# Patient Record
Sex: Female | Born: 1960 | Race: Black or African American | Hispanic: No | Marital: Single | Smoking: Current every day smoker
Health system: Southern US, Community
[De-identification: ages and names within clinical notes are randomized; demographics above are authoritative.]

## PROBLEM LIST (undated history)

## (undated) DIAGNOSIS — D649 Anemia, unspecified: Secondary | ICD-10-CM

## (undated) DIAGNOSIS — Z9289 Personal history of other medical treatment: Secondary | ICD-10-CM

## (undated) DIAGNOSIS — J42 Unspecified chronic bronchitis: Secondary | ICD-10-CM

## (undated) DIAGNOSIS — Z973 Presence of spectacles and contact lenses: Secondary | ICD-10-CM

## (undated) DIAGNOSIS — J45909 Unspecified asthma, uncomplicated: Secondary | ICD-10-CM

## (undated) DIAGNOSIS — J383 Other diseases of vocal cords: Secondary | ICD-10-CM

---

## 2005-01-19 ENCOUNTER — Emergency Department (HOSPITAL_COMMUNITY): Admission: EM | Admit: 2005-01-19 | Discharge: 2005-01-20 | Payer: Self-pay | Admitting: Emergency Medicine

## 2005-01-28 ENCOUNTER — Emergency Department (HOSPITAL_COMMUNITY): Admission: EM | Admit: 2005-01-28 | Discharge: 2005-01-28 | Payer: Self-pay | Admitting: Family Medicine

## 2006-01-18 ENCOUNTER — Emergency Department (HOSPITAL_COMMUNITY): Admission: EM | Admit: 2006-01-18 | Discharge: 2006-01-18 | Payer: Self-pay | Admitting: Family Medicine

## 2006-01-20 ENCOUNTER — Emergency Department (HOSPITAL_COMMUNITY): Admission: EM | Admit: 2006-01-20 | Discharge: 2006-01-20 | Payer: Self-pay | Admitting: Emergency Medicine

## 2006-11-05 ENCOUNTER — Emergency Department (HOSPITAL_COMMUNITY): Admission: EM | Admit: 2006-11-05 | Discharge: 2006-11-05 | Payer: Self-pay | Admitting: Emergency Medicine

## 2007-09-15 ENCOUNTER — Emergency Department (HOSPITAL_COMMUNITY): Admission: EM | Admit: 2007-09-15 | Discharge: 2007-09-16 | Payer: Self-pay | Admitting: Emergency Medicine

## 2007-09-15 ENCOUNTER — Ambulatory Visit: Payer: Self-pay | Admitting: Internal Medicine

## 2007-09-16 ENCOUNTER — Ambulatory Visit: Payer: Self-pay | Admitting: *Deleted

## 2007-10-19 ENCOUNTER — Encounter (INDEPENDENT_AMBULATORY_CARE_PROVIDER_SITE_OTHER): Payer: Self-pay | Admitting: Family Medicine

## 2007-10-19 ENCOUNTER — Ambulatory Visit: Payer: Self-pay | Admitting: Internal Medicine

## 2007-10-19 LAB — CONVERTED CEMR LAB
AST: 16 units/L (ref 0–37)
Albumin: 4.4 g/dL (ref 3.5–5.2)
BUN: 12 mg/dL (ref 6–23)
Basophils Relative: 0 % (ref 0–1)
CO2: 23 meq/L (ref 19–32)
Calcium: 10 mg/dL (ref 8.4–10.5)
Chloride: 105 meq/L (ref 96–112)
Cholesterol: 101 mg/dL (ref 0–200)
Creatinine, Ser: 1.03 mg/dL (ref 0.40–1.20)
Glucose, Bld: 88 mg/dL (ref 70–99)
HDL: 34 mg/dL — ABNORMAL LOW (ref >39)
Hemoglobin: 12.5 g/dL (ref 12.0–15.0)
Lymphocytes Relative: 27 % (ref 12–46)
Lymphs Abs: 3 10*3/uL (ref 0.7–4.0)
Monocytes Absolute: 0.8 10*3/uL (ref 0.1–1.0)
Monocytes Relative: 7 % (ref 3–12)
Neutro Abs: 6.9 10*3/uL (ref 1.7–7.7)
Neutrophils Relative %: 62 % (ref 43–77)
Potassium: 4.5 meq/L (ref 3.5–5.3)
RBC: 4.13 M/uL (ref 3.87–5.11)
WBC: 11.1 10*3/uL — ABNORMAL HIGH (ref 4.0–10.5)

## 2007-10-20 ENCOUNTER — Ambulatory Visit (HOSPITAL_COMMUNITY): Admission: RE | Admit: 2007-10-20 | Discharge: 2007-10-20 | Payer: Self-pay | Admitting: Family Medicine

## 2007-11-11 ENCOUNTER — Ambulatory Visit: Payer: Self-pay | Admitting: Family Medicine

## 2008-05-18 ENCOUNTER — Ambulatory Visit: Payer: Self-pay | Admitting: Internal Medicine

## 2008-05-18 ENCOUNTER — Other Ambulatory Visit: Admission: RE | Admit: 2008-05-18 | Discharge: 2008-05-18 | Payer: Self-pay | Admitting: Family Medicine

## 2008-05-18 ENCOUNTER — Encounter: Payer: Self-pay | Admitting: Family Medicine

## 2008-05-18 LAB — CONVERTED CEMR LAB: Chlamydia, DNA Probe: NEGATIVE

## 2009-05-24 ENCOUNTER — Ambulatory Visit: Payer: Self-pay | Admitting: Internal Medicine

## 2009-05-30 ENCOUNTER — Ambulatory Visit (HOSPITAL_COMMUNITY): Admission: RE | Admit: 2009-05-30 | Discharge: 2009-05-30 | Payer: Self-pay | Admitting: Family Medicine

## 2009-06-18 ENCOUNTER — Ambulatory Visit: Payer: Self-pay | Admitting: Internal Medicine

## 2009-07-05 ENCOUNTER — Ambulatory Visit: Payer: Self-pay | Admitting: Internal Medicine

## 2009-10-04 ENCOUNTER — Ambulatory Visit: Payer: Self-pay | Admitting: Obstetrics & Gynecology

## 2009-10-18 ENCOUNTER — Ambulatory Visit: Payer: Self-pay | Admitting: Family Medicine

## 2009-10-28 ENCOUNTER — Ambulatory Visit (HOSPITAL_COMMUNITY): Admission: RE | Admit: 2009-10-28 | Discharge: 2009-10-28 | Payer: Self-pay | Admitting: Internal Medicine

## 2009-10-29 ENCOUNTER — Ambulatory Visit (HOSPITAL_COMMUNITY): Admission: RE | Admit: 2009-10-29 | Discharge: 2009-10-29 | Payer: Self-pay | Admitting: Family Medicine

## 2009-11-17 ENCOUNTER — Inpatient Hospital Stay (HOSPITAL_COMMUNITY): Admission: AD | Admit: 2009-11-17 | Discharge: 2009-11-17 | Payer: Self-pay | Admitting: Obstetrics & Gynecology

## 2011-03-10 LAB — URINE MICROSCOPIC-ADD ON

## 2011-03-10 LAB — URINALYSIS, ROUTINE W REFLEX MICROSCOPIC
Bilirubin Urine: NEGATIVE
Glucose, UA: NEGATIVE mg/dL
Specific Gravity, Urine: >1.03 — ABNORMAL HIGH (ref 1.005–1.030)

## 2011-03-10 LAB — CBC
HCT: 32.8 % — ABNORMAL LOW (ref 36.0–46.0)
Hemoglobin: 10.8 g/dL — ABNORMAL LOW (ref 12.0–15.0)
MCHC: 32.9 g/dL (ref 30.0–36.0)
RBC: 3.73 MIL/uL — ABNORMAL LOW (ref 3.87–5.11)

## 2011-04-24 NOTE — Consult Note (Signed)
Shawna Rodgers, Shawna Rodgers                 ACCOUNT NO.:  [id number]   MEDICAL RECORD NO.:  [id number]          PATIENT TYPE:  EMS   LOCATION:  MAJO                         FACILITY:  [facility]   PHYSICIAN:  Karol T. Lazarus Salines, M.D. DATE OF BIRTH:  [birth year]   DATE OF CONSULTATION:  01/28/2005  DATE OF DISCHARGE:  01/28/2005                                   CONSULTATION   CHIEF COMPLAINT:  Hoarseness, progressive.   HISTORY:  A 50 year old black female, has had a slightly hoarse voice for  years.  For the past four to six weeks, she seemed to be getting slightly  worse as far as hoarseness.  One month ago she had what she claimed was  influenza but also strep throat at the same time, following which the voice  was a progressively worse.  She did lots of coughing during the flu.  For  the last week to 10 days, she feels like she  breathes better if she is  sitting up, including sleeping.  She also claims that she has some  difficulty and maybe even a little bit of pain with swallowing both liquids  and solids.  No obvious aspiration.  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  She has  been a smoker but is now down to one-quarter pack per day.  She has never  had a prior evaluation of her throat.  No history of trauma to the throat,  including foreign body ingestion, intubation, or endoscopy.  She presented  this afternoon to the [facility], where Dr. Artis Flock called  me for advice and I recommended that we evaluate her this evening given  complaints of breathing difficulty.   EXAMINATION:  GENERAL:  This is a slightly anxious but basically tearful,  somewhat overweight middle-aged black female. She is very aphonic when she  speaks but does have a decent stage whisper. She has a very slight  inspiratory stridor without obvious labor.  She is  not coughing or clearing  her throat.  Mental status is basically intact.  HEENT:  The head is atraumatic and neck supple.  Cranial nerves grossly  intact.  She has a soft wax impaction on the right side, and I could not see  the drum.  The left drum is normal.  The anterior nose shows a piercing on  the right nostril.  Internally, the  mucous membranes are healthy without  polyps or crusts.  Oral cavity is moist and teeth in good repair.  Oropharynx is fleshy with 2+ tonsils and a long, thick soft palate.  NECK:  Neck exam without adenopathy or thyromegaly.  I could not examine the  hypopharynx.   Following 10 mL of 1% viscous Xylocaine to both sides of the nose with  several minutes allowed for this to take effect, the flexible laryngoscope  was reduced through the left side.  The nasopharynx is clear.  Oropharynx  clear.  Hypopharynx reveals slight irregularity in the posterior commissure  but a normal epiglottis and no significant erythema or swelling.  The left  vocal cord looks essentially normal and mobile.  On the right vocal cord,  there is a semitriangular-shaped floppy soft tissue mass off the free edge  of the vocal cord,  impinging upon the airway so that the airway was  approximately 25% of normal.  The mass is mobile.  Has the surface  appearance of a granuloma.  No pooling in valleculae or pyriforms.   IMPRESSION:  1.  Right vocal cord granuloma.  2.  Reflux.  3.  Cigarette abuse.  4.  Dysphagia of uncertain significance.   PLAN:  I discussed this with her.  Given both her apprehension and her  legitimate airway issues, I would like to try to get this to resolve in  fairly short order.  I will give her a single dose of IV Decadron 8 mg in  the emergency room and then a brief burst and taper of prednisone 60 mg, 40  mg, 20 mg over three days' time.  I would like to give her Cipro for  antibacterial coverage 500 mg b.i.d.  We will check a barium swallow in the   immediate future .  I will see her back in office tomorrow and, if possible,  proceed with laryngoscopy and removal of the mass with frozen section and  further therapy as required later this week. She understands and agrees.      KTW/MEDQ  D:  01/28/2005  T:  01/29/2005  Job:  [id number]   cc:   Quita Skye. Artis Flock, M.D.  [address], [address]  [place]  [place] [address]  Fax: [phone]

## 2011-09-17 LAB — BASIC METABOLIC PANEL
BUN: 6
CO2: 24
Calcium: 9
Creatinine, Ser: 0.79
Glucose, Bld: 111 — ABNORMAL HIGH

## 2011-09-17 LAB — CBC
MCHC: 34
Platelets: 356
RDW: 13.8

## 2011-09-17 LAB — DIFFERENTIAL
Basophils Absolute: 0
Basophils Relative: 0
Monocytes Absolute: 0.8 — ABNORMAL HIGH
Neutro Abs: 14.9 — ABNORMAL HIGH
Neutrophils Relative %: 87 — ABNORMAL HIGH

## 2012-04-19 ENCOUNTER — Encounter (HOSPITAL_COMMUNITY): Payer: Self-pay | Admitting: *Deleted

## 2012-04-19 ENCOUNTER — Emergency Department (HOSPITAL_COMMUNITY)
Admission: EM | Admit: 2012-04-19 | Discharge: 2012-04-19 | Disposition: A | Discharge status: Home / Self Care | Payer: Medicaid Other | Attending: Emergency Medicine | Admitting: Emergency Medicine

## 2012-04-19 ENCOUNTER — Emergency Department (HOSPITAL_COMMUNITY): Payer: Medicaid Other

## 2012-04-19 DIAGNOSIS — R0609 Other forms of dyspnea: Secondary | ICD-10-CM | POA: Insufficient documentation

## 2012-04-19 DIAGNOSIS — R062 Wheezing: Secondary | ICD-10-CM | POA: Insufficient documentation

## 2012-04-19 DIAGNOSIS — R0602 Shortness of breath: Secondary | ICD-10-CM | POA: Insufficient documentation

## 2012-04-19 DIAGNOSIS — R059 Cough, unspecified: Secondary | ICD-10-CM | POA: Insufficient documentation

## 2012-04-19 DIAGNOSIS — R0989 Other specified symptoms and signs involving the circulatory and respiratory systems: Secondary | ICD-10-CM | POA: Insufficient documentation

## 2012-04-19 DIAGNOSIS — F17200 Nicotine dependence, unspecified, uncomplicated: Secondary | ICD-10-CM | POA: Insufficient documentation

## 2012-04-19 DIAGNOSIS — R05 Cough: Secondary | ICD-10-CM | POA: Insufficient documentation

## 2012-04-19 DIAGNOSIS — J40 Bronchitis, not specified as acute or chronic: Secondary | ICD-10-CM | POA: Insufficient documentation

## 2012-04-19 MED ORDER — PREDNISONE 50 MG PO TABS: 50.0000 mg | ORAL_TABLET | Freq: Every day | ORAL | Status: AC

## 2012-04-19 MED ORDER — DIPHENHYDRAMINE HCL 25 MG PO CAPS
25.0000 mg | ORAL_CAPSULE | Freq: Once | ORAL | Status: AC
  Administered 2012-04-19: 25 mg via ORAL
  Filled 2012-04-19: qty 1

## 2012-04-19 MED ORDER — PREDNISONE 20 MG PO TABS
60.0000 mg | ORAL_TABLET | ORAL | Status: AC
  Administered 2012-04-19: 60 mg via ORAL
  Filled 2012-04-19: qty 3

## 2012-04-19 MED ORDER — ALBUTEROL SULFATE HFA 108 (90 BASE) MCG/ACT IN AERS
2.0000 | INHALATION_SPRAY | Freq: Four times a day (QID) | RESPIRATORY_TRACT | Status: DC
  Administered 2012-04-19: 2 via RESPIRATORY_TRACT
  Filled 2012-04-19: qty 6.7

## 2012-04-19 MED ORDER — FAMOTIDINE 20 MG PO TABS
20.0000 mg | ORAL_TABLET | Freq: Once | ORAL | Status: AC
  Administered 2012-04-19: 20 mg via ORAL
  Filled 2012-04-19: qty 1

## 2012-04-19 MED ORDER — ALBUTEROL SULFATE (5 MG/ML) 0.5% IN NEBU
2.5000 mg | INHALATION_SOLUTION | RESPIRATORY_TRACT | Status: AC
  Administered 2012-04-19: 2.5 mg via RESPIRATORY_TRACT
  Filled 2012-04-19: qty 0.5

## 2012-04-19 NOTE — ED Provider Notes (Addendum)
History     CSN: [id number]  Arrival date & time 04/19/12  1745   First MD Initiated Contact with Patient 04/19/12 1808      Chief Complaint  Patient presents with  . Shortness of Breath     HPI Patient presents with concerns of cough and dyspnea.  She notes a long history of episodic bronchitis, but notes that in the past few years she is in generally well.  Approximately the patient gradually developed cough, and soon thereafter dyspnea.  Denies any associated chest pain.  She does complain of mild sore throat.  Is not taking any medication for relief, and notes that she previously used albuterol inhalers, which did work for her symptoms.  She denies any ongoing exertional worsening of her symptoms, fevers, chills, lightheadedness, nausea, vomiting, diarrhea. No past medical history on file.  History reviewed. No pertinent past surgical history.  No family history on file.  History  Substance Use Topics  . Smoking status: Current Some Day Smoker  . Smokeless tobacco: Not on file  . Alcohol Use: No    OB History    Grav Para Term Preterm Abortions TAB SAB Ect Mult Living                  Review of Systems  Constitutional:       HPI  HENT:       HPI otherwise negative  Eyes: Negative.   Respiratory:       HPI, otherwise negative  Cardiovascular:       HPI, otherwise nmegative  Gastrointestinal: Negative for vomiting.  Genitourinary:       HPI, otherwise negative  Musculoskeletal:       HPI, otherwise negative  Skin:       Hives, intermittently for one year - well controlled w benadryl  Neurological: Negative for syncope.    Allergies  Review of patient's allergies indicates no known allergies.  Home Medications  No current outpatient prescriptions on file.  BP 149/81  Pulse 83  Temp 98 F (36.7 C)  Resp 22  SpO2 100%  LMP 04/15/2012  Physical Exam  Nursing note and vitals reviewed. Constitutional: She is oriented to person, place, and time. She  appears well-developed and well-nourished. No distress.  HENT:  Head: Normocephalic and atraumatic.  Mouth/Throat: Uvula is midline, oropharynx is clear and moist and mucous membranes are normal. No oropharyngeal exudate, posterior oropharyngeal edema, posterior oropharyngeal erythema or tonsillar abscesses.  Eyes: Conjunctivae are normal. Right eye exhibits no discharge. Left eye exhibits no discharge.  Cardiovascular: Normal rate and regular rhythm.   Pulmonary/Chest: No accessory muscle usage or stridor. Not tachypneic. No respiratory distress. She has wheezes.  Abdominal: There is no tenderness.  Musculoskeletal: She exhibits no edema and no tenderness.  Neurological: She is alert and oriented to person, place, and time. No cranial nerve deficit. She exhibits normal muscle tone. Coordination normal.  Skin: Skin is warm and dry. She is not diaphoretic.    ED Course  Procedures (including critical care time)  Labs Reviewed - No data to display No results found.   No diagnosis found.  Pulse ox 100% ra- normal   MDM  This generally well-appearing female presents with one week of cough, mild dyspnea.  Although the patient smokes has no other notable risk factors for PE, and is not hypoxic or tachypneic, and has minimal dyspnea when not coughing.  The patient offers no lower extremity edema.  Given the patient's endorsement of  multiple prior episodes of bronchitis, there is some suggestion of reactive airway disease.  The patient received albuterol, steroids, and with a chest x-ray that does not demonstrate acute findings was discharged with an inhaler to follow up in one of our affiliated clinics.  Gerhard Munch, MD 04/19/12 1928  8:32 PM Prior to d/c the patient developed diffuse hives.  She actually had improved respirations (s/p albuterol) and denied any dyspnea / sense of throat edema.   She was provided benadryl / pepcid.    Gerhard Munch, MD 04/19/12 1931  Gerhard Munch, MD 04/19/12 2033

## 2012-04-19 NOTE — ED Notes (Signed)
Pt states she went out town and when she returned she felt like she maybe getting the sore throat. Pt states her voice has changed she has became horse. Pt states she becomes sob when trying to complete a sentence. Pt is alert and oriented at this time

## 2012-04-19 NOTE — Discharge Instructions (Signed)

## 2012-10-10 ENCOUNTER — Ambulatory Visit: Payer: Self-pay | Admitting: Obstetrics & Gynecology

## 2012-10-17 ENCOUNTER — Ambulatory Visit: Payer: Self-pay | Admitting: Obstetrics & Gynecology

## 2013-06-28 ENCOUNTER — Emergency Department (HOSPITAL_COMMUNITY): Payer: No Typology Code available for payment source

## 2013-06-28 ENCOUNTER — Encounter (HOSPITAL_COMMUNITY): Payer: Self-pay | Admitting: Emergency Medicine

## 2013-06-28 ENCOUNTER — Emergency Department (HOSPITAL_COMMUNITY)
Admission: EM | Admit: 2013-06-28 | Discharge: 2013-06-28 | Disposition: A | Discharge status: Home / Self Care | Payer: No Typology Code available for payment source | Attending: Emergency Medicine | Admitting: Emergency Medicine

## 2013-06-28 DIAGNOSIS — F17200 Nicotine dependence, unspecified, uncomplicated: Secondary | ICD-10-CM | POA: Insufficient documentation

## 2013-06-28 DIAGNOSIS — S139XXA Sprain of joints and ligaments of unspecified parts of neck, initial encounter: Secondary | ICD-10-CM | POA: Insufficient documentation

## 2013-06-28 DIAGNOSIS — IMO0002 Reserved for concepts with insufficient information to code with codable children: Secondary | ICD-10-CM | POA: Insufficient documentation

## 2013-06-28 DIAGNOSIS — Z8709 Personal history of other diseases of the respiratory system: Secondary | ICD-10-CM | POA: Insufficient documentation

## 2013-06-28 DIAGNOSIS — S161XXA Strain of muscle, fascia and tendon at neck level, initial encounter: Secondary | ICD-10-CM

## 2013-06-28 DIAGNOSIS — Y92410 Unspecified street and highway as the place of occurrence of the external cause: Secondary | ICD-10-CM | POA: Insufficient documentation

## 2013-06-28 DIAGNOSIS — Y9389 Activity, other specified: Secondary | ICD-10-CM | POA: Insufficient documentation

## 2013-06-28 MED ORDER — IBUPROFEN 800 MG PO TABS: 800.0000 mg | ORAL_TABLET | Freq: Three times a day (TID) | ORAL | Status: DC

## 2013-06-28 MED ORDER — HYDROCODONE-ACETAMINOPHEN 5-325 MG PO TABS: 1.0000 | ORAL_TABLET | ORAL | Status: DC | PRN

## 2013-06-28 MED ORDER — HYDROCODONE-ACETAMINOPHEN 5-325 MG PO TABS
1.0000 | ORAL_TABLET | Freq: Once | ORAL | Status: AC
  Administered 2013-06-28: 1 via ORAL
  Filled 2013-06-28: qty 1

## 2013-06-28 MED ORDER — CYCLOBENZAPRINE HCL 10 MG PO TABS: 10.0000 mg | ORAL_TABLET | Freq: Two times a day (BID) | ORAL | Status: DC | PRN

## 2013-06-28 MED ORDER — ASPIRIN 81 MG PO CHEW
324.0000 mg | CHEWABLE_TABLET | Freq: Once | ORAL | Status: AC
  Administered 2013-06-28: 324 mg via ORAL
  Filled 2013-06-28: qty 4
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
## 2013-06-29 NOTE — ED Provider Notes (Signed)
Medical screening examination/treatment/procedure(s) were performed by non-physician practitioner and as supervising physician I was immediately available for consultation/collaboration.   Tu Shimmel III, MD 06/29/13 1217 

## 2013-08-06 ENCOUNTER — Inpatient Hospital Stay (HOSPITAL_COMMUNITY)
Admission: EM | Admit: 2013-08-06 | Discharge: 2013-08-09 | DRG: 872 | Disposition: A | Discharge status: Home / Self Care | Payer: Medicaid Other | Attending: Internal Medicine | Admitting: Internal Medicine

## 2013-08-06 ENCOUNTER — Encounter (HOSPITAL_COMMUNITY): Payer: Self-pay | Admitting: Emergency Medicine

## 2013-08-06 DIAGNOSIS — N390 Urinary tract infection, site not specified: Secondary | ICD-10-CM

## 2013-08-06 DIAGNOSIS — J40 Bronchitis, not specified as acute or chronic: Secondary | ICD-10-CM | POA: Diagnosis present

## 2013-08-06 DIAGNOSIS — K869 Disease of pancreas, unspecified: Secondary | ICD-10-CM

## 2013-08-06 DIAGNOSIS — D649 Anemia, unspecified: Secondary | ICD-10-CM

## 2013-08-06 DIAGNOSIS — N10 Acute tubulo-interstitial nephritis: Secondary | ICD-10-CM

## 2013-08-06 DIAGNOSIS — D62 Acute posthemorrhagic anemia: Secondary | ICD-10-CM | POA: Diagnosis present

## 2013-08-06 DIAGNOSIS — A419 Sepsis, unspecified organism: Secondary | ICD-10-CM

## 2013-08-06 DIAGNOSIS — F17200 Nicotine dependence, unspecified, uncomplicated: Secondary | ICD-10-CM | POA: Diagnosis present

## 2013-08-06 HISTORY — DX: Unspecified chronic bronchitis: J42

## 2013-08-06 HISTORY — DX: Personal history of other medical treatment: Z92.89

## 2013-08-06 HISTORY — DX: Anemia, unspecified: D64.9

## 2013-08-06 HISTORY — DX: Unspecified asthma, uncomplicated: J45.909

## 2013-08-06 LAB — URINE MICROSCOPIC-ADD ON

## 2013-08-06 LAB — COMPREHENSIVE METABOLIC PANEL
BUN: 7 mg/dL (ref 6–23)
CO2: 26 mEq/L (ref 19–32)
Chloride: 95 mEq/L — ABNORMAL LOW (ref 96–112)
Creatinine, Ser: 0.8 mg/dL (ref 0.50–1.10)
GFR calc Af Amer: >90 mL/min (ref >90)
GFR calc non Af Amer: 85 mL/min — ABNORMAL LOW (ref >90)
Glucose, Bld: 116 mg/dL — ABNORMAL HIGH (ref 70–99)
Total Bilirubin: 0.3 mg/dL (ref 0.3–1.2)

## 2013-08-06 LAB — CBC WITH DIFFERENTIAL/PLATELET
Basophils Absolute: 0 10*3/uL (ref 0.0–0.1)
Basophils Relative: 0 % (ref 0–1)
Eosinophils Absolute: 0 10*3/uL (ref 0.0–0.7)
HCT: 25.2 % — ABNORMAL LOW (ref 36.0–46.0)
Hemoglobin: 7.6 g/dL — ABNORMAL LOW (ref 12.0–15.0)
MCH: 21.4 pg — ABNORMAL LOW (ref 26.0–34.0)
MCHC: 30.2 g/dL (ref 30.0–36.0)
Monocytes Absolute: 3.5 10*3/uL — ABNORMAL HIGH (ref 0.1–1.0)
Neutro Abs: 18.7 10*3/uL — ABNORMAL HIGH (ref 1.7–7.7)
RDW: 17 % — ABNORMAL HIGH (ref 11.5–15.5)

## 2013-08-06 LAB — URINALYSIS, ROUTINE W REFLEX MICROSCOPIC
Glucose, UA: NEGATIVE mg/dL
Ketones, ur: NEGATIVE mg/dL
Nitrite: NEGATIVE
Protein, ur: 100 mg/dL — AB
Urobilinogen, UA: 1 mg/dL (ref 0.0–1.0)

## 2013-08-06 LAB — LIPASE, BLOOD: Lipase: 20 U/L (ref 11–59)

## 2013-08-06 MED ORDER — SODIUM CHLORIDE 0.9 % IV BOLUS (SEPSIS)
1000.0000 mL | Freq: Once | INTRAVENOUS | Status: AC
  Administered 2013-08-06: 1000 mL via INTRAVENOUS

## 2013-08-06 MED ORDER — DEXTROSE 5 % IV SOLN
1.0000 g | Freq: Once | INTRAVENOUS | Status: AC
  Administered 2013-08-06: 1 g via INTRAVENOUS
  Filled 2013-08-06: qty 10

## 2013-08-06 MED ORDER — ACETAMINOPHEN 500 MG PO TABS
1000.0000 mg | ORAL_TABLET | Freq: Once | ORAL | Status: AC
  Administered 2013-08-06: 1000 mg via ORAL
  Filled 2013-08-06: qty 2

## 2013-08-06 MED ORDER — SODIUM CHLORIDE 0.9 % IV BOLUS (SEPSIS)
1000.0000 mL | Freq: Once | INTRAVENOUS | Status: AC
  Administered 2013-08-07: 1000 mL via INTRAVENOUS

## 2013-08-06 NOTE — ED Provider Notes (Signed)
CSN: [id number]     Arrival date & time 08/06/13  2044 History   First MD Initiated Contact with Patient 08/06/13 2057     Chief Complaint  Patient presents with  . Emesis   (Consider location/radiation/quality/duration/timing/severity/associated sxs/prior Treatment) HPI Comments: 52 year old female with no significant past medical history other than tobacco use who presents with a complaint of nausea, low back pain and low abdominal pain. Her symptoms started approximately one week ago, she was having some dysuria at that time and started taking over-the-counter medications for that. Since that time she has felt generally weak and today she has felt sweaty. The symptoms are persistent, nothing makes it better or worse, no associated coughing, diarrhea.    Patient is a 52 y.o. female presenting with vomiting. The history is provided by the patient and a relative.  Emesis   Past Medical History  Diagnosis Date  . Bronchitis    No past surgical history on file. No family history on file. History  Substance Use Topics  . Smoking status: Current Some Day Smoker  . Smokeless tobacco: Not on file  . Alcohol Use: No   OB History   Grav Para Term Preterm Abortions TAB SAB Ect Mult Living                 Review of Systems  Gastrointestinal: Positive for vomiting.  All other systems reviewed and are negative.    Allergies  Review of patient's allergies indicates no known allergies.  Home Medications   Current Outpatient Rx  Name  Route  Sig  Dispense  Refill  . Cranberry-Vitamin C-Probiotic (AZO CRANBERRY PO)   Oral   Take 2 tablets by mouth every morning.         . cyclobenzaprine (FLEXERIL) 10 MG tablet   Oral   Take 1 tablet (10 mg total) by mouth 2 (two) times daily as needed for muscle spasms.   20 tablet   0   . HYDROcodone-acetaminophen (NORCO/VICODIN) 5-325 MG per tablet   Oral   Take 1 tablet by mouth every 4 (four) hours as needed for pain.   15 tablet    0   . ibuprofen (ADVIL,MOTRIN) 800 MG tablet   Oral   Take 1 tablet (800 mg total) by mouth 3 (three) times daily.   21 tablet   0    BP 131/71  Pulse 96  Temp(Src) 101.6 F (38.7 C) (Rectal)  Resp 20  SpO2 100% Physical Exam  Nursing note and vitals reviewed. Constitutional: She appears well-developed and well-nourished. No distress.  HENT:  Head: Normocephalic and atraumatic.  Mouth/Throat: Oropharynx is clear and moist. No oropharyngeal exudate.  Eyes: Conjunctivae and EOM are normal. Pupils are equal, round, and reactive to light. Right eye exhibits no discharge. Left eye exhibits no discharge. No scleral icterus.  Neck: Normal range of motion. Neck supple. No JVD present. No thyromegaly present.  Cardiovascular: Regular rhythm, normal heart sounds and intact distal pulses.  Exam reveals no gallop and no friction rub.   No murmur heard. Mild tachycardia  Pulmonary/Chest: Effort normal and breath sounds normal. No respiratory distress. She has no wheezes. She has no rales.  Abdominal: Soft. Bowel sounds are normal. She exhibits no distension and no mass. There is tenderness ( SP ttp, mild, no guarding).  Musculoskeletal: Normal range of motion. She exhibits no edema and no tenderness.  Lymphadenopathy:    She has no cervical adenopathy.  Neurological: She is alert. Coordination normal.  Skin:  Skin is warm. No rash noted. She is diaphoretic. No erythema.  diaphoretic  Psychiatric: She has a normal mood and affect. Her behavior is normal.    ED Course  Procedures (including critical care time) Labs Review Labs Reviewed  CBC WITH DIFFERENTIAL - Abnormal; Notable for the following:    WBC 25.2 (*)    RBC 3.55 (*)    Hemoglobin 7.6 (*)    HCT 25.2 (*)    MCV 71.0 (*)    MCH 21.4 (*)    RDW 17.0 (*)    Platelets 443 (*)    Monocytes Relative 14 (*)    Neutro Abs 18.7 (*)    Monocytes Absolute 3.5 (*)    All other components within normal limits  COMPREHENSIVE  METABOLIC PANEL - Abnormal; Notable for the following:    Sodium 132 (*)    Potassium 3.4 (*)    Chloride 95 (*)    Glucose, Bld 116 (*)    Albumin 2.9 (*)    GFR calc non Af Amer 85 (*)    All other components within normal limits  URINALYSIS, ROUTINE W REFLEX MICROSCOPIC - Abnormal; Notable for the following:    APPearance TURBID (*)    Hgb urine dipstick LARGE (*)    Protein, ur 100 (*)    Leukocytes, UA LARGE (*)    All other components within normal limits  URINE MICROSCOPIC-ADD ON - Abnormal; Notable for the following:    Squamous Epithelial / LPF MANY (*)    Bacteria, UA MANY (*)    All other components within normal limits  URINE CULTURE  LIPASE, BLOOD  POCT PREGNANCY, URINE   Imaging Review No results found.  MDM   1. Sepsis   2. Anemia   3. UTI (lower urinary tract infection)    Pain, is SP, mild but she is febrile and mild tachycadia - labs, UA, fluids as she has not had any PO today.  Possible UTI.  No pain at McB point.  Laboratory workup shows that the patient has a significant leukocytosis of 25,000, hemoglobin is around 7 which is significantly lower than when last checked 2 years ago. I performed a rectal exam with chaperone present, there is 2 small nonthrombosed, nontender hemorrhoids with no fissure, no blood in the rectal vault and Hemoccult negative stool. She does admit to having heavy menstruation, no other history of abnormal bleeding. She has never required a blood transfusion.  Urinalysis shows significant urinary infection, she will be admitted to the hospital for infection which is likely become early sepsis. Blood pressure is stable, tachycardia is improving and pulse is now 95, temperature was measured rectally at 101.6.  D/w Dr. Toniann Fail who will admit.  Temp orders requested.  Vida Roller, MD 08/06/13 [phone]

## 2013-08-06 NOTE — ED Notes (Signed)
Pt. reports nausea ,  with low back pain for several days , body aches , pt.took OTC " AZO" pills for dysuria , also reports occasional diarrhea .
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
## 2013-08-07 ENCOUNTER — Encounter (HOSPITAL_COMMUNITY): Payer: Self-pay | Admitting: Internal Medicine

## 2013-08-07 ENCOUNTER — Inpatient Hospital Stay (HOSPITAL_COMMUNITY): Payer: Self-pay

## 2013-08-07 DIAGNOSIS — N10 Acute tubulo-interstitial nephritis: Secondary | ICD-10-CM

## 2013-08-07 DIAGNOSIS — N390 Urinary tract infection, site not specified: Secondary | ICD-10-CM

## 2013-08-07 DIAGNOSIS — K869 Disease of pancreas, unspecified: Secondary | ICD-10-CM

## 2013-08-07 DIAGNOSIS — A419 Sepsis, unspecified organism: Secondary | ICD-10-CM

## 2013-08-07 DIAGNOSIS — Z9289 Personal history of other medical treatment: Secondary | ICD-10-CM

## 2013-08-07 DIAGNOSIS — D649 Anemia, unspecified: Secondary | ICD-10-CM

## 2013-08-07 HISTORY — DX: Personal history of other medical treatment: Z92.89

## 2013-08-07 LAB — IRON AND TIBC: Iron: <10 ug/dL — ABNORMAL LOW (ref 42–135)

## 2013-08-07 LAB — URINE MICROSCOPIC-ADD ON

## 2013-08-07 LAB — URINALYSIS, ROUTINE W REFLEX MICROSCOPIC
Glucose, UA: NEGATIVE mg/dL
Specific Gravity, Urine: 1.023 (ref 1.005–1.030)

## 2013-08-07 LAB — FOLATE: Folate: 12.4 ng/mL

## 2013-08-07 LAB — CBC WITH DIFFERENTIAL/PLATELET
Basophils Absolute: 0 10*3/uL (ref 0.0–0.1)
Eosinophils Relative: 0 % (ref 0–5)
Lymphocytes Relative: 9 % — ABNORMAL LOW (ref 12–46)
Lymphs Abs: 1.9 10*3/uL (ref 0.7–4.0)
MCV: 71.2 fL — ABNORMAL LOW (ref 78.0–100.0)
Neutro Abs: 15.2 10*3/uL — ABNORMAL HIGH (ref 1.7–7.7)
Platelets: 422 10*3/uL — ABNORMAL HIGH (ref 150–400)
RBC: 3.33 MIL/uL — ABNORMAL LOW (ref 3.87–5.11)
RDW: 17.2 % — ABNORMAL HIGH (ref 11.5–15.5)
WBC: 21 10*3/uL — ABNORMAL HIGH (ref 4.0–10.5)

## 2013-08-07 LAB — RETICULOCYTES: RBC.: 3.33 MIL/uL — ABNORMAL LOW (ref 3.87–5.11)

## 2013-08-07 LAB — ABO/RH: ABO/RH(D): O POS

## 2013-08-07 LAB — VITAMIN B12: Vitamin B-12: 376 pg/mL (ref 211–911)

## 2013-08-07 MED ORDER — POTASSIUM CHLORIDE CRYS ER 20 MEQ PO TBCR
40.0000 meq | EXTENDED_RELEASE_TABLET | Freq: Once | ORAL | Status: AC
  Administered 2013-08-07: 40 meq via ORAL
  Filled 2013-08-07: qty 2

## 2013-08-07 MED ORDER — ACETAMINOPHEN 325 MG PO TABS
650.0000 mg | ORAL_TABLET | Freq: Four times a day (QID) | ORAL | Status: DC | PRN
  Administered 2013-08-07 – 2013-08-08 (×3): 650 mg via ORAL
  Filled 2013-08-07 (×4): qty 2

## 2013-08-07 MED ORDER — CYCLOBENZAPRINE HCL 10 MG PO TABS: 10.0000 mg | ORAL_TABLET | Freq: Two times a day (BID) | ORAL | Status: DC | PRN

## 2013-08-07 MED ORDER — ONDANSETRON HCL 4 MG/2ML IJ SOLN: 4.0000 mg | Freq: Four times a day (QID) | INTRAMUSCULAR | Status: DC | PRN

## 2013-08-07 MED ORDER — ALBUTEROL SULFATE (5 MG/ML) 0.5% IN NEBU
2.5000 mg | INHALATION_SOLUTION | Freq: Four times a day (QID) | RESPIRATORY_TRACT | Status: DC
  Administered 2013-08-07 (×3): 2.5 mg via RESPIRATORY_TRACT
  Filled 2013-08-07 (×4): qty 0.5

## 2013-08-07 MED ORDER — ALBUTEROL SULFATE (5 MG/ML) 0.5% IN NEBU
2.5000 mg | INHALATION_SOLUTION | Freq: Two times a day (BID) | RESPIRATORY_TRACT | Status: DC
  Administered 2013-08-08 – 2013-08-09 (×3): 2.5 mg via RESPIRATORY_TRACT
  Filled 2013-08-07 (×3): qty 0.5

## 2013-08-07 MED ORDER — IOHEXOL 300 MG/ML  SOLN
100.0000 mL | Freq: Once | INTRAMUSCULAR | Status: AC | PRN
  Administered 2013-08-07: 10:00:00 100 mL via INTRAVENOUS

## 2013-08-07 MED ORDER — ALBUTEROL SULFATE (5 MG/ML) 0.5% IN NEBU: 2.5000 mg | INHALATION_SOLUTION | Freq: Four times a day (QID) | RESPIRATORY_TRACT | Status: DC | PRN

## 2013-08-07 MED ORDER — BACID PO TABS
2.0000 | ORAL_TABLET | Freq: Three times a day (TID) | ORAL | Status: DC
  Filled 2013-08-07 (×3): qty 2

## 2013-08-07 MED ORDER — DEXTROSE 5 % IV SOLN
1.0000 g | INTRAVENOUS | Status: DC
  Administered 2013-08-07 – 2013-08-08 (×2): 1 g via INTRAVENOUS
  Filled 2013-08-07 (×4): qty 10

## 2013-08-07 MED ORDER — SODIUM CHLORIDE 0.9 % IV SOLN
INTRAVENOUS | Status: AC
  Administered 2013-08-07: 01:00:00 via INTRAVENOUS

## 2013-08-07 MED ORDER — ALBUTEROL SULFATE (5 MG/ML) 0.5% IN NEBU: 2.5000 mg | INHALATION_SOLUTION | RESPIRATORY_TRACT | Status: DC | PRN

## 2013-08-07 MED ORDER — SODIUM CHLORIDE 0.9 % IV SOLN
INTRAVENOUS | Status: DC
  Administered 2013-08-07 (×2): via INTRAVENOUS

## 2013-08-07 MED ORDER — SODIUM CHLORIDE 0.9 % IJ SOLN
3.0000 mL | Freq: Two times a day (BID) | INTRAMUSCULAR | Status: DC
  Administered 2013-08-07 – 2013-08-09 (×3): 3 mL via INTRAVENOUS

## 2013-08-07 MED ORDER — ACETAMINOPHEN 650 MG RE SUPP: 650.0000 mg | Freq: Four times a day (QID) | RECTAL | Status: DC | PRN

## 2013-08-07 MED ORDER — IOHEXOL 300 MG/ML  SOLN: 25.0000 mL | INTRAMUSCULAR | Status: AC

## 2013-08-07 MED ORDER — ONDANSETRON HCL 4 MG/2ML IJ SOLN: 4.0000 mg | Freq: Three times a day (TID) | INTRAMUSCULAR | Status: AC | PRN

## 2013-08-07 MED ORDER — IPRATROPIUM BROMIDE 0.02 % IN SOLN
0.5000 mg | Freq: Two times a day (BID) | RESPIRATORY_TRACT | Status: DC
  Administered 2013-08-08 – 2013-08-09 (×3): 0.5 mg via RESPIRATORY_TRACT
  Filled 2013-08-07 (×3): qty 2.5

## 2013-08-07 MED ORDER — POTASSIUM CHLORIDE IN NACL 20-0.9 MEQ/L-% IV SOLN
INTRAVENOUS | Status: DC
  Administered 2013-08-07: 03:00:00 via INTRAVENOUS
  Filled 2013-08-07 (×2): qty 1000

## 2013-08-07 MED ORDER — ONDANSETRON HCL 4 MG PO TABS: 4.0000 mg | ORAL_TABLET | Freq: Four times a day (QID) | ORAL | Status: DC | PRN

## 2013-08-07 MED ORDER — LACTINEX PO CHEW
2.0000 | CHEWABLE_TABLET | Freq: Three times a day (TID) | ORAL | Status: DC
  Administered 2013-08-07 – 2013-08-09 (×4): 2 via ORAL
  Filled 2013-08-07 (×12): qty 2

## 2013-08-07 MED ORDER — HYDROCODONE-ACETAMINOPHEN 5-325 MG PO TABS
1.0000 | ORAL_TABLET | ORAL | Status: DC | PRN
  Administered 2013-08-07 – 2013-08-09 (×5): 1 via ORAL
  Filled 2013-08-07: qty 1
  Filled 2013-08-07: qty 2
  Filled 2013-08-07 (×3): qty 1

## 2013-08-07 NOTE — H&P (Signed)
[facility] History and Physical  JONA ERKKILA WUJ:[id number] DOB: 09/22/1961 DOA: 08/06/2013  Referring physician: ER physician. PCP: No PCP Per Patient   Chief Complaint: Fever chills and body aches.  HPI: Shawna Rodgers is a 52 y.o. female with no significant past medical history with ongoing tobacco abuse presented to the ER because of fever chills with body aches. Patient also has been having nausea vomiting diarrhea since morning. Patient has been having fever and chills and body aches for last one week. Patient also has been having dysuria. Patient also states that her menstrual cycles have been heavy recently. Denies any chest pain or shortness of breath or any productive cough. In the ER patient was found to have severe anemia and stool for occult blood was negative. Initial ER physician was concerned about possible developing sepsis given the increased leukocytosis and fever. Patient has been admitted for further management of her UTI with possible developing sepsis. On my exam patient looks comfortable and is not in acute distress. Patient states that she has been getting abdominal crampy pain. Denies any recent use of antibiotics. She has had survey done a few months ago and since then she has been getting constant drainage of urine.  Review of Systems: As presented in the history of presenting illness, rest negative.  Past Medical History  Diagnosis Date  . Bronchitis    Past Surgical History  Procedure Laterality Date  . No past surgeries     Social History:  reports that she has been smoking.  She does not have any smokeless tobacco history on file. She reports that she does not drink alcohol or use illicit drugs. Home. where does patient live-- Can do ADLs. Can patient participate in ADLs?  No Known Allergies  Family History  Problem Relation Age of Onset  . Breast cancer Mother   . Diabetes Mellitus II Father       Prior to Admission medications   Medication  Sig Start Date End Date Taking? Authorizing Provider  Cranberry-Vitamin C-Probiotic (AZO CRANBERRY PO) Take 2 tablets by mouth every morning.   Yes Historical Provider, MD  cyclobenzaprine (FLEXERIL) 10 MG tablet Take 1 tablet (10 mg total) by mouth 2 (two) times daily as needed for muscle spasms. 06/28/13  Yes Tiffany Irine Seal, PA-C  HYDROcodone-acetaminophen (NORCO/VICODIN) 5-325 MG per tablet Take 1 tablet by mouth every 4 (four) hours as needed for pain. 06/28/13  Yes Tiffany Irine Seal, PA-C  ibuprofen (ADVIL,MOTRIN) 800 MG tablet Take 1 tablet (800 mg total) by mouth 3 (three) times daily. 06/28/13  Yes Dorthula Matas, PA-C   Physical Exam: Filed Vitals:   08/06/13 2230 08/07/13 0006 08/07/13 0015 08/07/13 0039  BP: 131/71 139/77 140/71 130/75  Pulse: 96 102 95 97  Temp:    99.7 F (37.6 C)  TempSrc:    Oral  Resp:    20  Height:    5\' 6"  (1.676 m)  Weight:    83.9 kg (184 lb 15.5 oz)  SpO2: 100% 100% 99% 100%     General:  Well-developed and nourished.  Eyes: Anicteric no pallor.  ENT: No discharge from ears eyes nose mouth.  Neck: No mass felt.  Cardiovascular: S1-S2 heard.  Respiratory: Bilateral expiratory wheeze. No crepitations.  Abdomen: Soft nontender bowel sounds present.  Skin: No rash.  Musculoskeletal: No edema.  Psychiatric: Appears normal.  Neurologic: Alert awake oriented to time place and person. Moves all extremities.  Labs on Admission:  Basic Metabolic  Panel:  Recent Labs Lab 08/06/13 2113  NA 132*  K 3.4*  CL 95*  CO2 26  GLUCOSE 116*  BUN 7  CREATININE 0.80  CALCIUM 9.0   Liver Function Tests:  Recent Labs Lab 08/06/13 2113  AST 14  ALT 16  ALKPHOS 61  BILITOT 0.3  PROT 7.3  ALBUMIN 2.9*    Recent Labs Lab 08/06/13 2113  LIPASE 20   No results found for this basename: AMMONIA,  in the last 168 hours CBC:  Recent Labs Lab 08/06/13 2113  WBC 25.2*  NEUTROABS 18.7*  HGB 7.6*  HCT 25.2*  MCV 71.0*  PLT 443*    Cardiac Enzymes: No results found for this basename: CKTOTAL, CKMB, CKMBINDEX, TROPONINI,  in the last 168 hours  BNP (last 3 results) No results found for this basename: PROBNP,  in the last 8760 hours CBG: No results found for this basename: GLUCAP,  in the last 168 hours  Radiological Exams on Admission: No results found.  Assessment/Plan Principal Problem:   UTI (lower urinary tract infection) Active Problems:   Anemia   1. UTI - continue ceftriaxone follow cultures. Check CT abdomen and pelvis since patient complains of significant dysuria with abdominal discomfort. 2. Leukocytosis - patient does not look septic on exam at this time. Closely follow CBC with differentials. Check C. difficile given the diarrhea. CT abdomen and pelvis has been ordered. 3. Severe anemia - probably acute on chronic. Patient's last hemoglobin 4 years ago was around 10. Check anemia panel. Patient has heavy menstrual cycle probably causing anemia. Patient states that her bleeding is stopped yesterday. Stool for occult blood was negative. 4. Bronchitis - patient advised to quit smoking. Patient has been placed on nebulizer and Pulmicort.    Code Status: Full code.  Family Communication: Patient's daughter at the bedside.  Disposition Plan: Admit to inpatient.    Zymere Patlan N. [facility] Pager [phone].  If 7PM-7AM, please contact night-coverage [email or web address] Password [email or web address] 08/07/2013, 1:19 AM

## 2013-08-07 NOTE — Progress Notes (Signed)
[facility] PROGRESS NOTE  JAHNE KRUKOWSKI ZOX:[id number] DOB: 1961/06/13 DOA: 08/06/2013 PCP: No PCP Per Patient  Assessment/Plan:  Acute Pyelonephritis Patient presented to the department overnight with complaints of fevers, chills, malaise reporting dysuria for the last 2 weeks. Urine showing presence of urinary tract infection. He was further worked up with a CT scan of abdomen and pelvis which revealed findings highly suggestive of acute pyelonephritis. She was started on empiric IV antibiotic therapy with Rocephin 1 g every 24 hours. Will followup on urine cultures and blood cultures. She remains hemodynamically stable. Continue supportive care, IV fluid resuscitation.  SIRS/Sepsis Present on admission, evidence by a white count of 25,200, heart rate of 107, temperature of 101.6. Source of infection to be urinary tract. CT scan of abdomen and pelvis showing findings suggestive of acute pyelonephritis. Followup on blood cultures and urine cultures.  1.5 x 1.2 cm lesion on head of pancreas CT scan of abdomen and pelvis showed a 1.5 x 1.2 cm well-defined low-attenuation lesion in the head of pancreas, incompletely characterized, with radiology recommending an MRI of the abdomen with without contrast to exclude a small cystic pancreatic neoplasm. MRI has been ordered.  Anemia, acute on chronic, likely secondary to acute blood loss anemia  Patient reporting having heavy menstrual cycles, recently and being last Saturday. She presented with a hemoglobin of 7.6, trending further down to 7.1 on this mornings lab work. She appears to be symptomatic as she complains of some shortness of breath, fatigue, generalized weakness. Will order a type and cross her and transfuse 1 unit of packed red blood cells  Fluids electrolytes nutrition Will run normal saline at 150 mL per hour, regular diet, monitor ins and outs  DVT prophylaxis Bilateral lower extremity SCDs    Code Status: Full code Family  Communication: I discussed plan with patient Disposition Plan: Continue empiric antibody therapy with Rocephin 1 g IV every 24 hours, followup on blood cultures, I anticipate she will require at least 2 nights hospitalization    Antibiotics:  Rocephin 1 g IV Q. 24 hours (started on 08/06/2013)  HPI/Subjective: Mrs. Torrisi was admitted overnight, presented to the emergency department with complaints of generalized weakness, malaise, fevers chills as well as dysuria which she has been having for the past 2 weeks. She had a CT scan of abdomen and pelvis done today which showed findings consistent with acute pyelonephritis. Patient was started on empiric IV antibiotic therapy with Rocephin 1 g IV every 24 hours. She remains hemodynamically stable, did have an episode of nausea and vomiting earlier this morning.    Objective: Filed Vitals:   08/07/13 0039  BP: 130/75  Pulse: 97  Temp: 99.7 F (37.6 C)  Resp: 20    Intake/Output Summary (Last 24 hours) at 08/07/13 1104 Last data filed at 08/07/13 0948  Gross per 24 hour  Intake   1447 ml  Output    600 ml  Net    847 ml   Filed Weights   08/07/13 0039  Weight: 83.9 kg (184 lb 15.5 oz)    Exam:   General:   Patient reporting generalized weakness, fatigue  Cardiovascular:  regular rate and rhythm normal S1-S2  Respiratory: normal respiratory effort lungs are clear to auscultation bilaterally  Abdomen: Abdomen is soft nontender nondistended positive bowel sounds  Musculoskeletal:  range of motion to all extremities  Data Reviewed: Basic Metabolic Panel:  Recent Labs Lab 08/06/13 2113  NA 132*  K 3.4*  CL 95*  CO2 26  GLUCOSE 116*  BUN 7  CREATININE 0.80  CALCIUM 9.0   Liver Function Tests:  Recent Labs Lab 08/06/13 2113  AST 14  ALT 16  ALKPHOS 61  BILITOT 0.3  PROT 7.3  ALBUMIN 2.9*    Recent Labs Lab 08/06/13 2113  LIPASE 20   No results found for this basename: AMMONIA,  in the last 168  hours CBC:  Recent Labs Lab 08/06/13 2113 08/07/13 0125  WBC 25.2* 21.0*  NEUTROABS 18.7* 15.2*  HGB 7.6* 7.1*  HCT 25.2* 23.7*  MCV 71.0* 71.2*  PLT 443* 422*   Cardiac Enzymes: No results found for this basename: CKTOTAL, CKMB, CKMBINDEX, TROPONINI,  in the last 168 hours BNP (last 3 results) No results found for this basename: PROBNP,  in the last 8760 hours CBG: No results found for this basename: GLUCAP,  in the last 168 hours  No results found for this or any previous visit (from the past 240 hour(s)).   Studies: Ct Abdomen Pelvis W Contrast  08/07/2013   *RADIOLOGY REPORT*  Clinical Data: Abdominal pain and discomfort.  Fever and chills.  CT ABDOMEN AND PELVIS WITH CONTRAST  Technique:  Multidetector CT imaging of the abdomen and pelvis was performed following the standard protocol during bolus administration of intravenous contrast.  Contrast: OMNIPAQUE IOHEXOL 300 MG/ML  SOLN  Comparison: No priors.  Findings:  Lung Bases: Unremarkable.  Abdomen/Pelvis:  The appearance of the liver, gallbladder, spleen, bilateral adrenal glands of the left kidney is unremarkable.  The right kidney demonstrates heterogeneous decreased enhancement, with a somewhat striated appearance, concerning for pyelonephritis. There is perinephric stranding around the right kidney. Within the head of the pancreas there is a 1.5 x 1.2 cm low attenuation lesion.  The remainder the pancreas is otherwise unremarkable in appearance.  No significant volume of ascites.  No pneumoperitoneum.  No pathologic distension of small bowel.  There are numerous colonic diverticula, without definite surrounding inflammatory changes to suggest an acute diverticulitis at this time.  Normal appendix. Trace amount of free fluid in the low right pericolic gutter, presumably reactive from the process in the right kidney.  Uterus appears enlarged and there are at least two uterine lesions, one of which appears centered in the fundus  encroaching upon the endometrial canal, and likely represent a large submucosal fibroid, measuring approximately 6.3 x 5.9 x 6.3 cm.  Enlarged uterus results in mass effect upon the adjacent urinary bladder which is displaced downward.  3.2 cm low attenuation lesion in the right ovary is simple in appearance, favored to represent an ovarian cyst.  Left ovary is unremarkable in appearance.  Musculoskeletal: There are no aggressive appearing lytic or blastic lesions noted in the visualized portions of the skeleton.  IMPRESSION: 1.  The appearance of the right kidney is strongly suggestive of acute pyelonephritis.  Clinical correlation and urinalysis is recommended. 2.  Trace volume of free fluid in the right paracolic gutter is presumably reactive. 3.  1.5 x 1.2 cm well defined low attenuation lesion in the head of the pancreas is incompletely characterized on today's examination. This may simply represent a small pancreatic pseudocyst, but warrants further characterization with non emergent MRI of the abdomen with and without gadolinium to exclude a small cystic pancreatic neoplasm. 3.  Colonic diverticulosis without definite findings to suggest acute diverticulitis at this time. 4.  Small right ovarian cyst. 5.  Fibroid uterus, as above.  These results were called by telephone on 08/07/2013 at  10:20 a.m. to nurse Junius Roads (for Dr. Toniann Fail, who verbally acknowledged these results.   Original Report Authenticated By: Trudie Reed, M.D.   Dg Chest Port 1 View  08/07/2013   *RADIOLOGY REPORT*  Clinical Data: Wheezing.  PORTABLE CHEST - 1 VIEW  Comparison: Chest x-ray 04/19/2012.  Findings: Lung volumes are normal.  No consolidative airspace disease.  No pleural effusions.  No pneumothorax.  No pulmonary nodule or mass noted.  Pulmonary vasculature and the cardiomediastinal silhouette are within normal limits.  IMPRESSION: 1. No radiographic evidence of acute cardiopulmonary disease.   Original Report Authenticated  By: Trudie Reed, M.D.    Scheduled Meds: . sodium chloride   Intravenous STAT  . albuterol  2.5 mg Nebulization Q6H  . cefTRIAXone (ROCEPHIN)  IV  1 g Intravenous Q24H  . lactobacillus acidophilus  2 tablet Oral TID  . potassium chloride  40 mEq Oral Once  . sodium chloride  3 mL Intravenous Q12H   Continuous Infusions: . sodium chloride      Principal Problem:   UTI (lower urinary tract infection) Active Problems:   Anemia    Time spent:  45 minutes    Shawna Rodgers  [facility] Pager [phone]. If 7PM-7AM, please contact night-coverage at [email or web address], password [email or web address] 08/07/2013, 11:04 AM  LOS: 1 day

## 2013-08-07 NOTE — Progress Notes (Signed)
Post blood transfusion pt's temp increase of 2.2 degree to 102. Notified blood bank and MD. Urinalysis obtained and sent. Tylenol 2 Po given at 1530. No other c/o pain or chills. See doc flow sheet for all vital signs and frequency.

## 2013-08-08 ENCOUNTER — Inpatient Hospital Stay (HOSPITAL_COMMUNITY): Payer: Medicaid Other

## 2013-08-08 LAB — BASIC METABOLIC PANEL
Calcium: 8.6 mg/dL (ref 8.4–10.5)
GFR calc non Af Amer: >90 mL/min (ref >90)
Sodium: 135 mEq/L (ref 135–145)

## 2013-08-08 LAB — TYPE AND SCREEN: Unit division: 0

## 2013-08-08 LAB — CBC
MCH: 23 pg — ABNORMAL LOW (ref 26.0–34.0)
Platelets: 394 10*3/uL (ref 150–400)
RBC: 3.22 MIL/uL — ABNORMAL LOW (ref 3.87–5.11)
WBC: 19.4 10*3/uL — ABNORMAL HIGH (ref 4.0–10.5)

## 2013-08-08 LAB — URINE CULTURE: Colony Count: >=100000

## 2013-08-08 LAB — PATHOLOGIST SMEAR REVIEW

## 2013-08-08 MED ORDER — GADOBENATE DIMEGLUMINE 529 MG/ML IV SOLN
20.0000 mL | Freq: Once | INTRAVENOUS | Status: AC | PRN
  Administered 2013-08-08: 11:00:00 18 mL via INTRAVENOUS

## 2013-08-08 NOTE — Progress Notes (Signed)
Utilization Review Completed Beckham Capistran J. Tannia Contino, RN, BSN, NCM [phone]  

## 2013-08-08 NOTE — Progress Notes (Signed)
[facility] PROGRESS NOTE  Shawna Rodgers UYQ:[id number] DOB: [birth year] DOA: 08/06/2013 PCP: No PCP Per Patient  Assessment/Plan:  Acute Pyelonephritis Patient presently on ceftriaxone 1 g IV every 24 hours. Her blood cultures have remained negative x2 sets with urine culture growing Escherichia coli. Susceptibility testing is pending at the time of this dictation. Patient overall reports feeling much better tolerating by mouth intake. I think once susceptibility testing is completed she may go home on oral antimicrobial therapy.  SIRS/Sepsis Present on admission, evidence by a white count of 25,200, heart rate of 107, temperature of 101.6. Source of infection to be urinary tract. Her white count is coming down from 25,200-19,000. She reports feeling much better today. Followup on finalization of urine cultures.  1.5 x 1.2 cm lesion on head of pancreas CT scan of abdomen and pelvis showed a 1.5 x 1.2 cm well-defined low-attenuation lesion in the head of pancreas, incompletely characterized. She is currently going down to radiology for MRI.   Anemia, acute on chronic, likely secondary to acute blood loss anemia  Patient reports feeling much better after receiving one unit of packed red blood cells yesterday. Hemoglobin improved from 7.1-7.4 after one unit of blood. I believe this likely reflects acute blood loss anemia in setting of heavy menses, which was present over the weekend. She will need GYN followup  Fluids electrolytes nutrition She is tolerating by mouth intake, we'll discontinue IV fluid  DVT prophylaxis Bilateral lower extremity SCDs    Code Status: Full code Family Communication: I discussed plan with patient Disposition Plan: Continue empiric antibody therapy with Rocephin 1 g IV every 24 hours, followup on blood cultures, I anticipate she will require at least 2 nights hospitalization    Antibiotics:  Rocephin 1 g IV Q. 24 hours (started on  08/06/2013)  HPI/Subjectiv She reports feeling much better this morning, having more energy, ambulating around her room, tolerating po intake. Awaiting susceptibility testing from cultures and MRI findings.   Objective: Filed Vitals:   08/08/13 0623  BP: 120/69  Pulse: 75  Temp: 97.3 F (36.3 C)  Resp: 18    Intake/Output Summary (Last 24 hours) at 08/08/13 1025 Last data filed at 08/08/13 0700  Gross per 24 hour  Intake 1717.5 ml  Output   1650 ml  Net   67.5 ml   Filed Weights   08/07/13 0039 08/08/13 0623  Weight: 83.9 kg (184 lb 15.5 oz) 85.367 kg (188 lb 3.2 oz)    Exam:   General:   Patient reporting generalized weakness, fatigue  Cardiovascular:  regular rate and rhythm normal S1-S2  Respiratory: normal respiratory effort lungs are clear to auscultation bilaterally  Abdomen: Abdomen is soft nontender nondistended positive bowel sounds  Musculoskeletal:  range of motion to all extremities  Data Reviewed: Basic Metabolic Panel:  Recent Labs Lab 08/06/13 2113 08/08/13 0420  NA 132* 135  K 3.4* 3.8  CL 95* 103  CO2 26 22  GLUCOSE 116* 131*  BUN 7 6  CREATININE 0.80 0.64  CALCIUM 9.0 8.6   Liver Function Tests:  Recent Labs Lab 08/06/13 2113  AST 14  ALT 16  ALKPHOS 61  BILITOT 0.3  PROT 7.3  ALBUMIN 2.9*    Recent Labs Lab 08/06/13 2113  LIPASE 20   No results found for this basename: AMMONIA,  in the last 168 hours CBC:  Recent Labs Lab 08/06/13 2113 08/07/13 0125 08/08/13 0420  WBC 25.2* 21.0* 19.4*  NEUTROABS 18.7* 15.2*  --  HGB 7.6* 7.1* 7.4*  HCT 25.2* 23.7* 23.4*  MCV 71.0* 71.2* 72.7*  PLT 443* 422* 394   Cardiac Enzymes: No results found for this basename: CKTOTAL, CKMB, CKMBINDEX, TROPONINI,  in the last 168 hours BNP (last 3 results) No results found for this basename: PROBNP,  in the last 8760 hours CBG: No results found for this basename: GLUCAP,  in the last 168 hours  Recent Results (from the past 240  hour(s))  URINE CULTURE     Status: None   Collection Time    08/06/13  9:58 PM      Result Value Range Status   Specimen Description URINE, CLEAN CATCH   Final   Special Requests ADDED 2232   Final   Culture  Setup Time     Final   Value: 08/07/2013 02:11     Performed at [facility] Count     Final   Value: >=100,000 COLONIES/ML     Performed at [facility]   Culture     Final   Value: ESCHERICHIA COLI     Performed at [facility]   Report Status PENDING   Incomplete  CULTURE, BLOOD (ROUTINE X 2)     Status: None   Collection Time    08/07/13 11:20 AM      Result Value Range Status   Specimen Description BLOOD LEFT HAND   Final   Special Requests BOTTLES DRAWN AEROBIC AND ANAEROBIC 10CC   Final   Culture  Setup Time     Final   Value: 08/07/2013 18:53     Performed at [facility]   Culture     Final   Value:        BLOOD CULTURE RECEIVED NO GROWTH TO DATE CULTURE WILL BE HELD FOR 5 DAYS BEFORE ISSUING A FINAL NEGATIVE REPORT     Performed at [facility]   Report Status PENDING   Incomplete  CULTURE, BLOOD (ROUTINE X 2)     Status: None   Collection Time    08/07/13 11:30 AM      Result Value Range Status   Specimen Description BLOOD RIGHT ARM   Final   Special Requests BOTTLES DRAWN AEROBIC AND ANAEROBIC 10CC   Final   Culture  Setup Time     Final   Value: 08/07/2013 18:52     Performed at [facility]   Culture     Final   Value:        BLOOD CULTURE RECEIVED NO GROWTH TO DATE CULTURE WILL BE HELD FOR 5 DAYS BEFORE ISSUING A FINAL NEGATIVE REPORT     Performed at [facility]   Report Status PENDING   Incomplete     Studies: Ct Abdomen Pelvis W Contrast  08/07/2013   *RADIOLOGY REPORT*  Clinical Data: Abdominal pain and discomfort.  Fever and chills.  CT ABDOMEN AND PELVIS WITH CONTRAST  Technique:  Multidetector CT imaging of the abdomen and pelvis was performed following the standard protocol  during bolus administration of intravenous contrast.  Contrast: OMNIPAQUE IOHEXOL 300 MG/ML  SOLN  Comparison: No priors.  Findings:  Lung Bases: Unremarkable.  Abdomen/Pelvis:  The appearance of the liver, gallbladder, spleen, bilateral adrenal glands of the left kidney is unremarkable.  The right kidney demonstrates heterogeneous decreased enhancement, with a somewhat striated appearance, concerning for pyelonephritis. There is perinephric stranding around the right kidney. Within the head of the pancreas there  is a 1.5 x 1.2 cm low attenuation lesion.  The remainder the pancreas is otherwise unremarkable in appearance.  No significant volume of ascites.  No pneumoperitoneum.  No pathologic distension of small bowel.  There are numerous colonic diverticula, without definite surrounding inflammatory changes to suggest an acute diverticulitis at this time.  Normal appendix. Trace amount of free fluid in the low right pericolic gutter, presumably reactive from the process in the right kidney.  Uterus appears enlarged and there are at least two uterine lesions, one of which appears centered in the fundus encroaching upon the endometrial canal, and likely represent a large submucosal fibroid, measuring approximately 6.3 x 5.9 x 6.3 cm.  Enlarged uterus results in mass effect upon the adjacent urinary bladder which is displaced downward.  3.2 cm low attenuation lesion in the right ovary is simple in appearance, favored to represent an ovarian cyst.  Left ovary is unremarkable in appearance.  Musculoskeletal: There are no aggressive appearing lytic or blastic lesions noted in the visualized portions of the skeleton.  IMPRESSION: 1.  The appearance of the right kidney is strongly suggestive of acute pyelonephritis.  Clinical correlation and urinalysis is recommended. 2.  Trace volume of free fluid in the right paracolic gutter is presumably reactive. 3.  1.5 x 1.2 cm well defined low attenuation lesion in the head  of the pancreas is incompletely characterized on today's examination. This may simply represent a small pancreatic pseudocyst, but warrants further characterization with non emergent MRI of the abdomen with and without gadolinium to exclude a small cystic pancreatic neoplasm. 3.  Colonic diverticulosis without definite findings to suggest acute diverticulitis at this time. 4.  Small right ovarian cyst. 5.  Fibroid uterus, as above.  These results were called by telephone on 08/07/2013 at 10:20 a.m. to nurse Junius Roads (for Dr. Toniann Fail, who verbally acknowledged these results.   Original Report Authenticated By: Trudie Reed, M.D.   Dg Chest Port 1 View  08/07/2013   *RADIOLOGY REPORT*  Clinical Data: Wheezing.  PORTABLE CHEST - 1 VIEW  Comparison: Chest x-ray 04/19/2012.  Findings: Lung volumes are normal.  No consolidative airspace disease.  No pleural effusions.  No pneumothorax.  No pulmonary nodule or mass noted.  Pulmonary vasculature and the cardiomediastinal silhouette are within normal limits.  IMPRESSION: 1. No radiographic evidence of acute cardiopulmonary disease.   Original Report Authenticated By: Trudie Reed, M.D.    Scheduled Meds: . albuterol  2.5 mg Nebulization BID  . cefTRIAXone (ROCEPHIN)  IV  1 g Intravenous Q24H  . ipratropium  0.5 mg Nebulization BID  . lactobacillus acidophilus & bulgar  2 tablet Oral TID WC  . sodium chloride  3 mL Intravenous Q12H   Continuous Infusions: . sodium chloride Stopped (08/08/13 1019)    Principal Problem:   UTI (lower urinary tract infection) Active Problems:   Anemia    Time spent:  45 minutes    Jeralyn Bennett  [facility] Pager [phone]. If 7PM-7AM, please contact night-coverage at [email or web address], password [facility] 08/08/2013, 10:25 AM  LOS: 2 days

## 2013-08-08 NOTE — Progress Notes (Deleted)
[id number] spoken with IP  Pt  Still no BM / diarhrea  Since 2  Days ago . OK to d/xc isolation until another episode will occur

## 2013-08-08 NOTE — Progress Notes (Signed)
1853  delerd progress report  As of0is marked as true and valid

## 2013-08-09 LAB — CBC
Hemoglobin: 8.2 g/dL — ABNORMAL LOW (ref 12.0–15.0)
MCHC: 30.8 g/dL (ref 30.0–36.0)

## 2013-08-09 LAB — BASIC METABOLIC PANEL
GFR calc Af Amer: >90 mL/min (ref >90)
GFR calc non Af Amer: >90 mL/min (ref >90)
Glucose, Bld: 102 mg/dL — ABNORMAL HIGH (ref 70–99)
Potassium: 4.1 mEq/L (ref 3.5–5.1)
Sodium: 136 mEq/L (ref 135–145)

## 2013-08-09 MED ORDER — SULFAMETHOXAZOLE-TMP DS 800-160 MG PO TABS: 1.0000 | ORAL_TABLET | Freq: Two times a day (BID) | ORAL | Status: DC

## 2013-08-09 MED ORDER — FERROUS SULFATE 325 (65 FE) MG PO TBEC: 325.0000 mg | DELAYED_RELEASE_TABLET | Freq: Three times a day (TID) | ORAL | Status: DC

## 2013-08-09 NOTE — Progress Notes (Signed)
Pt. Alert and oriented this am. No s/s of distress or discomfort noted. Pt. C/o of back pain x1 during the night. Pain medication administered and effective. No further complaints noted. Pt. Resting in room quietly. Call light within reach.  RN will continue to monitor pt. For changes in condition. Jefrey Raburn, Cheryll Dessert

## 2013-08-09 NOTE — Discharge Summary (Addendum)
Physician Discharge Summary  Shawna Rodgers ZOX:[id number] DOB: [birth year] DOA: 08/06/2013  PCP: No PCP Per Patient  Admit date: 08/06/2013 Discharge date: 08/09/2013  Time spent: 35 minutes  Recommendations for Outpatient Follow-up:  1. Follow up with PCP  Discharge Diagnoses:  Principal Problem:   UTI (lower urinary tract infection) Active Problems:   Anemia   Discharge Condition: stable  Diet recommendation: heart healthy  Filed Weights   08/07/13 0039 08/08/13 0623 08/09/13 0517  Weight: 83.9 kg (184 lb 15.5 oz) 85.367 kg (188 lb 3.2 oz) 85.049 kg (187 lb 8 oz)    History of present illness:  52 y.o. female with no significant past medical history with ongoing tobacco abuse presented to the ER because of fever chills with body aches. Patient also has been having nausea vomiting diarrhea since morning. Patient has been having fever and chills and body aches for last one week. Patient also has been having dysuria. Patient also states that her menstrual cycles have been heavy recently. Denies any chest pain or shortness of breath or any productive cough. In the ER patient was found to have severe anemia and stool for occult blood was negative. Initial ER physician was concerned about possible developing sepsis given the increased leukocytosis and fever. Patient has been admitted for further management of her UTI with possible developing sepsis. On my exam patient looks comfortable and is not in acute distress. Patient states that she has been getting abdominal crampy pain. Denies any recent use of antibiotics. She has had survey done a few months ago and since then she has been getting constant drainage of urine.   Hospital Course:  Acute Pyelonephritis  - started empirically on ceftriaxone 1 g IV every 24 hours.  - Her blood cultures have remained negative x2 sets. - Urine culture growing Escherichia coli. Sensitive to bactrim.  - Cont bactrim at home.  Sepsis  - Present on  admission, evidence by a white count of 25,200, heart rate of 107, temperature of 101.6.  - Due to UTI.  1.5 x 1.2 cm lesion on head of pancreas  - CT scan of abdomen and pelvis showed a 1.5 x 1.2 cm well-defined low-attenuation lesion in the head of pancreas, incompletely characterized.  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 - follow up yearly imiging.  Anemia, acute on chronic, likely secondary to acute blood loss anemia  - Patient reports feeling much better after receiving one unit of packed red blood cells yesterday. -  Hemoglobin improved from 7.1-7.4 after one unit of blood.  - I believe this likely heavy menses. She will need GYN followup as an outpatinet.   Procedures:  MRI  CT abd  Consultations:  none  Discharge Exam: Filed Vitals:   08/09/13 0517  BP: 138/77  Pulse: 76  Temp: 98.2 F (36.8 C)  Resp: 18    General: A&O x3 Cardiovascular: RRR Respiratory: good air movement CTA B/L  Discharge Instructions      Discharge Orders   Future Orders Complete By Expires   Diet - low sodium heart healthy  As directed    Increase activity slowly  As directed        Medication List         AZO CRANBERRY PO  Take 2 tablets by mouth every morning.     cyclobenzaprine 10 MG tablet  Commonly known as:  FLEXERIL  Take 1 tablet (10 mg total) by mouth 2 (two) times daily as needed for  muscle spasms.     ferrous sulfate 325 (65 FE) MG EC tablet  Take 1 tablet (325 mg total) by mouth 3 (three) times daily with meals.     HYDROcodone-acetaminophen 5-325 MG per tablet  Commonly known as:  NORCO/VICODIN  Take 1 tablet by mouth every 4 (four) hours as needed for pain.     ibuprofen 800 MG tablet  Commonly known as:  ADVIL,MOTRIN  Take 1 tablet (800 mg total) by mouth 3 (three) times daily.     sulfamethoxazole-trimethoprim 800-160 MG per tablet  Commonly known as:  BACTRIM DS  Take 1 tablet by mouth 2 (two) times daily.       No Known  Allergies Follow-up Information   Follow up with No PCP Per Patient In 4 weeks. (follow up yearly imiging of abdomen to monitor cystadenoma)    Specialty:  General Practice   Contact information:   [address] [place] [address] [phone]        The results of significant diagnostics from this hospitalization (including imaging, microbiology, ancillary and laboratory) are listed below for reference.    Significant Diagnostic Studies: Mr Abdomen W Wo Contrast  08/08/2013   *RADIOLOGY REPORT*  Clinical Data:  evaluate pancreatic lesion  MRI ABDOMEN WITH AND WITHOUT CONTRAST  Technique:  Multiplanar multisequence MR imaging of the abdomen was performed both before and after administration of intravenous contrast.  Contrast: 18mL MULTIHANCE GADOBENATE DIMEGLUMINE 529 MG/ML IV SOLN  Comparison: None.  Findings: No pleural effusions identified.  No pericardial effusion.  Normal appearance of the liver.  No suspicious abnormalities identified.  The common bile duct appears within normal limits.  No biliary dilatation.  Cystic lesion in the head of pancreas measures 1.5 cm and contains several small cysts.  The smallest of these measures 1.3 cm.  The remaining portions of the pancreas appear within normal limits.  Normal appearance of the spleen.  The adrenal glands are unremarkable.  The left kidney appears within normal limits.  Striated nephrographic appearance of the right kidney is identified compatible with pyelonephritis.  Normal caliber of the abdominal aorta.  No aneurysm.  No ascites identified within the upper abdomen.  The signal from within the bone marrow appears normal.  IMPRESSION:  1.  Microcystic lesion within head of pancreas is identified.  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  Follow-up imaging advised to confirm stability/spitting into.  For a lesion of this size, follow-up imaging with MR or CT yearly for 4 years is advised. 2.  Right sided  pyelonephritis.   Original Report Authenticated By: Signa Kell, M.D.   Ct Abdomen Pelvis W Contrast  08/07/2013   *RADIOLOGY REPORT*  Clinical Data: Abdominal pain and discomfort.  Fever and chills.  CT ABDOMEN AND PELVIS WITH CONTRAST  Technique:  Multidetector CT imaging of the abdomen and pelvis was performed following the standard protocol during bolus administration of intravenous contrast.  Contrast: OMNIPAQUE IOHEXOL 300 MG/ML  SOLN  Comparison: No priors.  Findings:  Lung Bases: Unremarkable.  Abdomen/Pelvis:  The appearance of the liver, gallbladder, spleen, bilateral adrenal glands of the left kidney is unremarkable.  The right kidney demonstrates heterogeneous decreased enhancement, with a somewhat striated appearance, concerning for pyelonephritis. There is perinephric stranding around the right kidney. Within the head of the pancreas there is a 1.5 x 1.2 cm low attenuation lesion.  The remainder the pancreas is otherwise unremarkable in appearance.  No significant volume of ascites.  No  pneumoperitoneum.  No pathologic distension of small bowel.  There are numerous colonic diverticula, without definite surrounding inflammatory changes to suggest an acute diverticulitis at this time.  Normal appendix. Trace amount of free fluid in the low right pericolic gutter, presumably reactive from the process in the right kidney.  Uterus appears enlarged and there are at least two uterine lesions, one of which appears centered in the fundus encroaching upon the endometrial canal, and likely represent a large submucosal fibroid, measuring approximately 6.3 x 5.9 x 6.3 cm.  Enlarged uterus results in mass effect upon the adjacent urinary bladder which is displaced downward.  3.2 cm low attenuation lesion in the right ovary is simple in appearance, favored to represent an ovarian cyst.  Left ovary is unremarkable in appearance.  Musculoskeletal: There are no aggressive appearing lytic or blastic lesions  noted in the visualized portions of the skeleton.  IMPRESSION: 1.  The appearance of the right kidney is strongly suggestive of acute pyelonephritis.  Clinical correlation and urinalysis is recommended. 2.  Trace volume of free fluid in the right paracolic gutter is presumably reactive. 3.  1.5 x 1.2 cm well defined low attenuation lesion in the head of the pancreas is incompletely characterized on today's examination. This may simply represent a small pancreatic pseudocyst, but warrants further characterization with non emergent MRI of the abdomen with and without gadolinium to exclude a small cystic pancreatic neoplasm. 3.  Colonic diverticulosis without definite findings to suggest acute diverticulitis at this time. 4.  Small right ovarian cyst. 5.  Fibroid uterus, as above.  These results were called by telephone on 08/07/2013 at 10:20 a.m. to nurse Junius Roads (for Dr. Toniann Fail, who verbally acknowledged these results.   Original Report Authenticated By: Trudie Reed, M.D.   Dg Chest Port 1 View  08/07/2013   *RADIOLOGY REPORT*  Clinical Data: Wheezing.  PORTABLE CHEST - 1 VIEW  Comparison: Chest x-ray 04/19/2012.  Findings: Lung volumes are normal.  No consolidative airspace disease.  No pleural effusions.  No pneumothorax.  No pulmonary nodule or mass noted.  Pulmonary vasculature and the cardiomediastinal silhouette are within normal limits.  IMPRESSION: 1. No radiographic evidence of acute cardiopulmonary disease.   Original Report Authenticated By: Trudie Reed, M.D.    Microbiology: Recent Results (from the past 240 hour(s))  URINE CULTURE     Status: None   Collection Time    08/06/13  9:58 PM      Result Value Range Status   Specimen Description URINE, CLEAN CATCH   Final   Special Requests ADDED 2232   Final   Culture  Setup Time     Final   Value: 08/07/2013 02:11     Performed at [facility] Count     Final   Value: >=100,000 COLONIES/ML     Performed at [facility]   Culture     Final   Value: ESCHERICHIA COLI     Performed at [facility]   Report Status 08/08/2013 FINAL   Final   Organism ID, Bacteria ESCHERICHIA COLI   Final  CULTURE, BLOOD (ROUTINE X 2)     Status: None   Collection Time    08/07/13 11:20 AM      Result Value Range Status   Specimen Description BLOOD LEFT HAND   Final   Special Requests BOTTLES DRAWN AEROBIC AND ANAEROBIC 10CC   Final   Culture  Setup Time     Final  Value: 08/07/2013 18:53     Performed at [facility]   Culture     Final   Value:        BLOOD CULTURE RECEIVED NO GROWTH TO DATE CULTURE WILL BE HELD FOR 5 DAYS BEFORE ISSUING A FINAL NEGATIVE REPORT     Performed at [facility]   Report Status PENDING   Incomplete  CULTURE, BLOOD (ROUTINE X 2)     Status: None   Collection Time    08/07/13 11:30 AM      Result Value Range Status   Specimen Description BLOOD RIGHT ARM   Final   Special Requests BOTTLES DRAWN AEROBIC AND ANAEROBIC 10CC   Final   Culture  Setup Time     Final   Value: 08/07/2013 18:52     Performed at [facility]   Culture     Final   Value:        BLOOD CULTURE RECEIVED NO GROWTH TO DATE CULTURE WILL BE HELD FOR 5 DAYS BEFORE ISSUING A FINAL NEGATIVE REPORT     Performed at [facility]   Report Status PENDING   Incomplete     Labs: Basic Metabolic Panel:  Recent Labs Lab 08/06/13 2113 08/08/13 0420 08/09/13 0448  NA 132* 135 136  K 3.4* 3.8 4.1  CL 95* 103 102  CO2 26 22 24   GLUCOSE 116* 131* 102*  BUN 7 6 8   CREATININE 0.80 0.64 0.64  CALCIUM 9.0 8.6 9.3   Liver Function Tests:  Recent Labs Lab 08/06/13 2113  AST 14  ALT 16  ALKPHOS 61  BILITOT 0.3  PROT 7.3  ALBUMIN 2.9*    Recent Labs Lab 08/06/13 2113  LIPASE 20   No results found for this basename: AMMONIA,  in the last 168 hours CBC:  Recent Labs Lab 08/06/13 2113 08/07/13 0125 08/08/13 0420 08/09/13 0448  WBC 25.2* 21.0* 19.4* 15.2*   NEUTROABS 18.7* 15.2*  --   --   HGB 7.6* 7.1* 7.4* 8.2*  HCT 25.2* 23.7* 23.4* 26.6*  MCV 71.0* 71.2* 72.7* 72.9*  PLT 443* 422* 394 438*   Cardiac Enzymes: No results found for this basename: CKTOTAL, CKMB, CKMBINDEX, TROPONINI,  in the last 168 hours BNP: BNP (last 3 results) No results found for this basename: PROBNP,  in the last 8760 hours CBG: No results found for this basename: GLUCAP,  in the last 168 hours     Signed:  Marinda Elk  [facility] 08/09/2013, 12:41 PM

## 2013-08-09 NOTE — Progress Notes (Signed)
Pt. Given discharge instructions, questions answered. Encouraged pt to find PCP to follow her medically. D/'d IV no complaints at this time. Instructed pt to to call with ready for discharge out.

## 2013-08-09 NOTE — Progress Notes (Signed)
Pt refused wheelchair, discharged home.

## 2013-08-14 LAB — CULTURE, BLOOD (ROUTINE X 2): Culture: NO GROWTH

## 2013-12-06 ENCOUNTER — Encounter (HOSPITAL_COMMUNITY): Payer: Self-pay | Admitting: Emergency Medicine

## 2013-12-06 ENCOUNTER — Emergency Department (HOSPITAL_COMMUNITY)
Admission: EM | Admit: 2013-12-06 | Discharge: 2013-12-06 | Disposition: A | Discharge status: Home / Self Care | Payer: Self-pay | Attending: Emergency Medicine | Admitting: Emergency Medicine

## 2013-12-06 DIAGNOSIS — Z791 Long term (current) use of non-steroidal anti-inflammatories (NSAID): Secondary | ICD-10-CM | POA: Insufficient documentation

## 2013-12-06 DIAGNOSIS — Z3202 Encounter for pregnancy test, result negative: Secondary | ICD-10-CM | POA: Insufficient documentation

## 2013-12-06 DIAGNOSIS — D649 Anemia, unspecified: Secondary | ICD-10-CM | POA: Insufficient documentation

## 2013-12-06 DIAGNOSIS — Z79899 Other long term (current) drug therapy: Secondary | ICD-10-CM | POA: Insufficient documentation

## 2013-12-06 DIAGNOSIS — N898 Other specified noninflammatory disorders of vagina: Secondary | ICD-10-CM | POA: Insufficient documentation

## 2013-12-06 DIAGNOSIS — I10 Essential (primary) hypertension: Secondary | ICD-10-CM | POA: Insufficient documentation

## 2013-12-06 DIAGNOSIS — J45909 Unspecified asthma, uncomplicated: Secondary | ICD-10-CM | POA: Insufficient documentation

## 2013-12-06 DIAGNOSIS — F17200 Nicotine dependence, unspecified, uncomplicated: Secondary | ICD-10-CM | POA: Insufficient documentation

## 2013-12-06 DIAGNOSIS — N939 Abnormal uterine and vaginal bleeding, unspecified: Secondary | ICD-10-CM

## 2013-12-06 DIAGNOSIS — Z792 Long term (current) use of antibiotics: Secondary | ICD-10-CM | POA: Insufficient documentation

## 2013-12-06 LAB — CBC WITH DIFFERENTIAL/PLATELET
Eosinophils Absolute: 0.3 10*3/uL (ref 0.0–0.7)
Eosinophils Relative: 3 % (ref 0–5)
HCT: 35.3 % — ABNORMAL LOW (ref 36.0–46.0)
Hemoglobin: 11.3 g/dL — ABNORMAL LOW (ref 12.0–15.0)
Lymphs Abs: 2.8 10*3/uL (ref 0.7–4.0)
MCH: 29 pg (ref 26.0–34.0)
MCV: 90.7 fL (ref 78.0–100.0)
Monocytes Relative: 7 % (ref 3–12)
RBC: 3.89 MIL/uL (ref 3.87–5.11)

## 2013-12-06 LAB — URINALYSIS, ROUTINE W REFLEX MICROSCOPIC
Nitrite: NEGATIVE
Specific Gravity, Urine: 1.029 (ref 1.005–1.030)
Urobilinogen, UA: 0.2 mg/dL (ref 0.0–1.0)

## 2013-12-06 LAB — URINE MICROSCOPIC-ADD ON

## 2013-12-06 LAB — WET PREP, GENITAL: Yeast Wet Prep HPF POC: NONE SEEN

## 2013-12-06 LAB — BASIC METABOLIC PANEL
CO2: 27 mEq/L (ref 19–32)
Calcium: 9.2 mg/dL (ref 8.4–10.5)
Glucose, Bld: 91 mg/dL (ref 70–99)
Sodium: 138 mEq/L (ref 137–147)

## 2013-12-06 LAB — POCT PREGNANCY, URINE: Preg Test, Ur: NEGATIVE

## 2013-12-06 MED ORDER — HYDROCODONE-ACETAMINOPHEN 5-325 MG PO TABS: 1.0000 | ORAL_TABLET | ORAL | Status: DC | PRN

## 2013-12-06 MED ORDER — HYDROCODONE-ACETAMINOPHEN 5-325 MG PO TABS
2.0000 | ORAL_TABLET | Freq: Once | ORAL | Status: AC
  Administered 2013-12-06: 2 via ORAL
  Filled 2013-12-06: qty 2

## 2013-12-06 NOTE — ED Notes (Signed)
Pelvic cart at bedside. 

## 2013-12-06 NOTE — ED Provider Notes (Signed)
CSN: [id number]     Arrival date & time 12/06/13  1250 History   First MD Initiated Contact with Patient 12/06/13 1339     Chief Complaint  Patient presents with  . Vaginal Bleeding   (Consider location/radiation/quality/duration/timing/severity/associated sxs/prior Treatment) HPI Comments: Patient presents to the ED with a chief complaint of vaginal bleeding.  She states that she was spotting yesterday, but has been bleeding very heavily since this morning.  She states that she has gone through multiple pads and tampons.  She has seen large blood clots.  She is also complaining of moderate abdominal cramping.  She has not tried taking anything to alleviate her symptoms.  Nothing makes her symptoms better or worse.  The history is provided by the patient. No language interpreter was used.    Past Medical History  Diagnosis Date  . Hypertension   . Asthma   . Chronic bronchitis   . History of blood transfusion 08/07/2013    "first one I've ever had was today" (08/07/2013)  . Anemia    Past Surgical History  Procedure Laterality Date  . No past surgeries     Family History  Problem Relation Age of Onset  . Breast cancer Mother   . Diabetes Mellitus II Father    History  Substance Use Topics  . Smoking status: Current Every Day Smoker -- 0.50 packs/day for 32 years    Types: Cigarettes  . Smokeless tobacco: Never Used  . Alcohol Use: Yes     Comment: 08/07/2013 "holidays, birthdays I'll have a mixed drink or glass of wine"   OB History   Grav Para Term Preterm Abortions TAB SAB Ect Mult Living                 Review of Systems  All other systems reviewed and are negative.    Allergies  Review of patient's allergies indicates no known allergies.  Home Medications   Current Outpatient Rx  Name  Route  Sig  Dispense  Refill  . Cranberry-Vitamin C-Probiotic (AZO CRANBERRY PO)   Oral   Take 2 tablets by mouth every morning.         . cyclobenzaprine (FLEXERIL) 10 MG  tablet   Oral   Take 1 tablet (10 mg total) by mouth 2 (two) times daily as needed for muscle spasms.   20 tablet   0   . ferrous sulfate 325 (65 FE) MG EC tablet   Oral   Take 1 tablet (325 mg total) by mouth 3 (three) times daily with meals.   90 tablet   3   . HYDROcodone-acetaminophen (NORCO/VICODIN) 5-325 MG per tablet   Oral   Take 1 tablet by mouth every 4 (four) hours as needed for pain.   15 tablet   0   . ibuprofen (ADVIL,MOTRIN) 800 MG tablet   Oral   Take 1 tablet (800 mg total) by mouth 3 (three) times daily.   21 tablet   0   . sulfamethoxazole-trimethoprim (BACTRIM DS) 800-160 MG per tablet   Oral   Take 1 tablet by mouth 2 (two) times daily.   20 tablet   0    BP 154/96  Pulse 108  Temp(Src) 97.6 F (36.4 C) (Oral)  Resp 18  SpO2 98%  LMP 12/06/2013 Physical Exam  Nursing note and vitals reviewed. Constitutional: She is oriented to person, place, and time. She appears well-developed and well-nourished.  HENT:  Head: Normocephalic and atraumatic.  Eyes: Conjunctivae and  EOM are normal. Pupils are equal, round, and reactive to light.  Neck: Normal range of motion. Neck supple.  Cardiovascular: Normal rate and regular rhythm.  Exam reveals no gallop and no friction rub.   No murmur heard. Pulmonary/Chest: Effort normal and breath sounds normal. No respiratory distress. She has no wheezes. She has no rales. She exhibits no tenderness.  Abdominal: Soft. Bowel sounds are normal. She exhibits no distension and no mass. There is no tenderness. There is no rebound and no guarding. Hernia confirmed negative in the right inguinal area and confirmed negative in the left inguinal area.  Genitourinary: No labial fusion. There is no rash, tenderness, lesion or injury on the right labia. There is no rash, tenderness, lesion or injury on the left labia. Uterus is not deviated, not enlarged, not fixed and not tender. Cervix exhibits no motion tenderness, no discharge  and no friability. Right adnexum displays no mass, no tenderness and no fullness. Left adnexum displays no mass, no tenderness and no fullness. There is bleeding around the vagina. No erythema or tenderness around the vagina. No foreign body around the vagina. No signs of injury around the vagina. No vaginal discharge found.  Multiple large dark red blood clots, no trauma or hemorrhage  Musculoskeletal: Normal range of motion. She exhibits no edema and no tenderness.  Lymphadenopathy:       Right: No inguinal adenopathy present.       Left: No inguinal adenopathy present.  Neurological: She is alert and oriented to person, place, and time.  Skin: Skin is warm and dry.  Psychiatric: She has a normal mood and affect. Her behavior is normal. Judgment and thought content normal.    ED Course  Procedures (including critical care time) Results for orders placed during the hospital encounter of 12/06/13  URINALYSIS, ROUTINE W REFLEX MICROSCOPIC      Result Value Range   Color, Urine RED (*) YELLOW   APPearance CLOUDY (*) CLEAR   Specific Gravity, Urine 1.029  1.005 - 1.030   pH 5.5  5.0 - 8.0   Glucose, UA NEGATIVE  NEGATIVE mg/dL   Hgb urine dipstick LARGE (*) NEGATIVE   Bilirubin Urine NEGATIVE  NEGATIVE   Ketones, ur 15 (*) NEGATIVE mg/dL   Protein, ur 30 (*) NEGATIVE mg/dL   Urobilinogen, UA 0.2  0.0 - 1.0 mg/dL   Nitrite NEGATIVE  NEGATIVE   Leukocytes, UA SMALL (*) NEGATIVE  CBC WITH DIFFERENTIAL      Result Value Range   WBC 8.7  4.0 - 10.5 K/uL   RBC 3.89  3.87 - 5.11 MIL/uL   Hemoglobin 11.3 (*) 12.0 - 15.0 g/dL   HCT 16.1 (*) 09.6 - 04.5 %   MCV 90.7  78.0 - 100.0 fL   MCH 29.0  26.0 - 34.0 pg   MCHC 32.0  30.0 - 36.0 g/dL   RDW 40.9 (*) 81.1 - 91.4 %   Platelets 352  150 - 400 K/uL   Neutrophils Relative % 58  43 - 77 %   Neutro Abs 5.1  1.7 - 7.7 K/uL   Lymphocytes Relative 32  12 - 46 %   Lymphs Abs 2.8  0.7 - 4.0 K/uL   Monocytes Relative 7  3 - 12 %   Monocytes  Absolute 0.6  0.1 - 1.0 K/uL   Eosinophils Relative 3  0 - 5 %   Eosinophils Absolute 0.3  0.0 - 0.7 K/uL   Basophils Relative 0  0 -  1 %   Basophils Absolute 0.0  0.0 - 0.1 K/uL  URINE MICROSCOPIC-ADD ON      Result Value Range   Squamous Epithelial / LPF FEW (*) RARE   WBC, UA 3-6  <3 WBC/hpf   RBC / HPF TOO NUMEROUS TO COUNT  <3 RBC/hpf   Bacteria, UA RARE  RARE  POCT PREGNANCY, URINE      Result Value Range   Preg Test, Ur NEGATIVE  NEGATIVE   No results found.  Imaging Review No results found.  EKG Interpretation   None       MDM   1. Vaginal bleeding     Patient with heavy vaginal bleeding.  H/H is stable.  Patient is not dizzy.  No hemorrhage on pelvic exam.  Multiple blood clots are cleaned out on the pelvic.  Discussed the patient with Dr. Fonnie Jarvis who recommends discharge or optional [place].  I asked the patient if she would like to proceed with the [place] today, and she says that she is getting insurance tomorrow and that she would like to defer at this time.  [facility] with [facility].  Strict return precautions are given.  Patient is stable and ready for discharge.   Roxy Horseman, PA-C 12/06/13 [phone]

## 2013-12-06 NOTE — ED Notes (Signed)
Pt reports having heavy vaginal bleeding this am. Reports hx of heavy bleeding but this time is having severe cramping, bleeding through pads and clothes and passing large clots.

## 2013-12-06 NOTE — ED Notes (Signed)
Pt given change of clothes and sanitary pads at triage

## 2013-12-06 NOTE — ED Provider Notes (Signed)
Medical screening examination/treatment/procedure(s) were performed by non-physician practitioner and as supervising physician I was immediately available for consultation/collaboration.  EKG Interpretation   None        Hurman Horn, MD 12/06/13 2049

## 2013-12-07 LAB — GC/CHLAMYDIA PROBE AMP
CT Probe RNA: NEGATIVE
GC Probe RNA: NEGATIVE

## 2014-02-09 ENCOUNTER — Other Ambulatory Visit (HOSPITAL_COMMUNITY)
Admission: RE | Admit: 2014-02-09 | Discharge: 2014-02-09 | Disposition: A | Discharge status: Home / Self Care | Payer: BC Managed Care – PPO | Source: Ambulatory Visit | Attending: Obstetrics & Gynecology | Admitting: Obstetrics & Gynecology

## 2014-02-09 ENCOUNTER — Ambulatory Visit (INDEPENDENT_AMBULATORY_CARE_PROVIDER_SITE_OTHER): Payer: BC Managed Care – PPO | Admitting: Obstetrics & Gynecology

## 2014-02-09 ENCOUNTER — Encounter: Payer: Self-pay | Admitting: Obstetrics & Gynecology

## 2014-02-09 VITALS — BP 139/90 | HR 86 | Temp 98.7°F | Ht 65.0 in | Wt 200.1 lb

## 2014-02-09 DIAGNOSIS — N920 Excessive and frequent menstruation with regular cycle: Secondary | ICD-10-CM

## 2014-02-09 DIAGNOSIS — Z01419 Encounter for gynecological examination (general) (routine) without abnormal findings: Secondary | ICD-10-CM

## 2014-02-09 DIAGNOSIS — D219 Benign neoplasm of connective and other soft tissue, unspecified: Secondary | ICD-10-CM

## 2014-02-09 DIAGNOSIS — D259 Leiomyoma of uterus, unspecified: Secondary | ICD-10-CM

## 2014-02-09 LAB — POCT PREGNANCY, URINE: Preg Test, Ur: NEGATIVE

## 2014-02-09 MED ORDER — MEGESTROL ACETATE 40 MG PO TABS: 40.0000 mg | ORAL_TABLET | Freq: Two times a day (BID) | ORAL | Status: DC

## 2014-02-09 MED ORDER — MELOXICAM 15 MG PO TABS: 15.0000 mg | ORAL_TABLET | Freq: Two times a day (BID) | ORAL | Status: DC | PRN

## 2014-02-09 NOTE — Patient Instructions (Addendum)
Thank you for enrolling in [facility]. Please follow the instructions below to securely access your online medical record. MyChart allows you to send messages to your doctor, view your test results, manage appointments, and more.   How Do I Sign Up? 1. In your Internet browser, go to [employer] and enter [email or web address]. 2. Click on the Sign Up Now link in the Sign In box. You will see the New Member Sign Up page. 3. Enter your MyChart Access Code exactly as it appears below. You will not need to use this code after you've completed the sign-up process. If you do not sign up before the expiration date, you must request a new code.  MyChart Access Code: [email or web address] Expires: 04/10/2014 11:55 AM  4. Enter your Social Security Number ([id number]) and Date of Birth (mm/dd/yyyy) as indicated and click Submit. You will be taken to the next sign-up page. 5. Create a MyChart ID. This will be your MyChart login ID and cannot be changed, so think of one that is secure and easy to remember. 6. Create a MyChart password. You can change your password at any time. 7. Enter your Password Reset Question and Answer. This can be used at a later time if you forget your password.  8. Enter your e-mail address. You will receive e-mail notification when new information is available in [facility]. 9. Click Sign Up. You can now view your medical record.   Additional Information Remember, MyChart is NOT to be used for urgent needs. For medical emergencies, dial 911.    Hysterectomy Information  A hysterectomy is a surgery in which your uterus is removed. This surgery may be done to treat various medical problems. After the surgery, you will no longer have menstrual periods. The surgery will also make you unable to become pregnant (sterile). The fallopian tubes and ovaries can be removed (bilateral salpingo-oophorectomy) during this surgery as well.  REASONS FOR A HYSTERECTOMY  Persistent, abnormal  bleeding.  Lasting (chronic) pelvic pain or infection.  The lining of the uterus (endometrium) starts growing outside the uterus (endometriosis).  The endometrium starts growing in the muscle of the uterus (adenomyosis).  The uterus falls down into the vagina (pelvic organ prolapse).  Noncancerous growths in the uterus (uterine fibroids) that cause symptoms.  Precancerous cells.  Cervical cancer or uterine cancer. TYPES OF HYSTERECTOMIES  Supracervical hysterectomy In this type, the top part of the uterus is removed, but not the cervix.  Total hysterectomy The uterus and cervix are removed.  Radical hysterectomy The uterus, the cervix, and the fibrous tissue that holds the uterus in place in the pelvis (parametrium) are removed. WAYS A HYSTERECTOMY CAN BE PERFORMED  Abdominal hysterectomy A large surgical cut (incision) is made in the abdomen. The uterus is removed through this incision.  Vaginal hysterectomy An incision is made in the vagina. The uterus is removed through this incision. There are no abdominal incisions.  Conventional laparoscopic hysterectomy Three or four small incisions are made in the abdomen. A thin, lighted tube with a camera (laparoscope) is inserted into one of the incisions. Other tools are put through the other incisions. The uterus is cut into small pieces. The small pieces are removed through the incisions, or they are removed through the vagina.  Laparoscopically assisted vaginal hysterectomy (LAVH) Three or four small incisions are made in the abdomen. Part of the surgery is performed laparoscopically and part vaginally. The uterus is removed through the vagina.  Robot-assisted laparoscopic hysterectomy A laparoscope and other  tools are inserted into 3 or 4 small incisions in the abdomen. A computer-controlled device is used to give the surgeon a 3D image and to help control the surgical instruments. This allows for more precise movements of surgical  instruments. The uterus is cut into small pieces and removed through the incisions or removed through the vagina. RISKS AND COMPLICATIONS  Possible complications associated with this procedure include:  Bleeding and risk of blood transfusion. Tell your health care provider if you do not want to receive any blood products.  Blood clots in the legs or lung.  Infection.  Injury to surrounding organs.  Problems or side effects related to anesthesia.  Conversion to an abdominal hysterectomy from one of the other techniques. WHAT TO EXPECT AFTER A HYSTERECTOMY  You will be given pain medicine.  You will need to have someone with you for the first 3 5 days after you go home.  You will need to follow up with your surgeon in 2 4 weeks after surgery to evaluate your progress.  You may have early menopause symptoms such as hot flashes, night sweats, and insomnia.  If you had a hysterectomy for a problem that was not cancer or not a condition that could lead to cancer, then you no longer need Pap tests. However, even if you no longer need a Pap test, a regular exam is a good idea to make sure no other problems are starting. Document Released: 05/19/2001 Document Revised: 09/13/2013 Document Reviewed: 07/31/2013 [facility] Patient Information 2014 [place].

## 2014-02-09 NOTE — Progress Notes (Signed)
CLINIC ENCOUNTER NOTE  History:  53 y.o. [id number] here today for annual exam and surgical consultation for fibroid uterus and menorrhagia.  She reports using multiple pads and tampons and sees large blood clots. She also complains of associated moderate abdominal cramping. Also increased urinary frequency due to fibroids. She wants a hysterectomy.  The following portions of the patient's history were reviewed and updated as appropriate: allergies, current medications, past family history, past medical history, past social history, past surgical history and problem list.  Review of Systems:  Pertinent items are noted in HPI.  Objective:  Physical Exam BP 139/90  Pulse 86  Temp(Src) 98.7 F (37.1 C) (Oral)  Ht 5\' 5"  (1.651 m)  Wt 200 lb 1.6 oz (90.765 kg)  BMI 33.30 kg/m2  LMP 01/14/2014 GENERAL: Well-developed, well-nourished female in no acute distress.  HEENT: Normocephalic, atraumatic. Sclerae anicteric.  NECK: Supple. Normal thyroid.  LUNGS: Clear to auscultation bilaterally.  HEART: Regular rate and rhythm. BREASTS: Symmetric in size. No masses, skin changes, nipple drainage, or lymphadenopathy. ABDOMEN: Soft, nontender, nondistended. No organomegaly. PELVIC: Normal external female genitalia. Vagina is pink and rugated.  Normal discharge. Normal cervix contour. Pap smear obtained. Uterus is enlarged, about 16 week size.  No adnexal mass or tenderness.  EXTREMITIES: No cyanosis, clubbing, or edema, 2+ distal pulses.  ENDOMETRIAL BIOPSY     The indications for endometrial biopsy were reviewed.   Risks of the biopsy including cramping, bleeding, infection, uterine perforation, inadequate specimen and need for additional procedures  were discussed. The patient states she understands and agrees to undergo procedure today. Consent was signed. Time out was performed. Urine HCG was negative. During the pelvic exam, the cervix was prepped with Betadine. A single-toothed tenaculum was  placed on the anterior lip of the cervix to stabilize it. The 3 mm pipelle was introduced into the endometrial cavity without difficulty to a depth of 13 cm, and a moderate amount of tissue was obtained and sent to pathology. The instruments were removed from the patient's vagina. Minimal bleeding from the cervix was noted. The patient tolerated the procedure well. Routine post-procedure instructions were given to the patient.      Labs and Imaging 08/07/2013 CT ABDOMEN AND PELVIS WITH CONTRAST Clinical Data: Abdominal pain and discomfort. Fever and chills. Contrast: 148mL OMNIPAQUE IOHEXOL 300 MG/ML SOLN Comparison: No priors. Findings: Lung Bases: Unremarkable.  Abdomen/Pelvis: The appearance of the liver, gallbladder, spleen, bilateral adrenal glands of the left kidney is unremarkable. The right kidney demonstrates heterogeneous decreased enhancement, with a somewhat striated appearance, concerning for pyelonephritis. There is perinephric stranding around the right kidney. Within the head of the pancreas there is a 1.5 x 1.2 cm low attenuation lesion. The remainder the pancreas is otherwise unremarkable in appearance. No significant volume of ascites. No pneumoperitoneum. No pathologic distension of small bowel. There are numerous colonic diverticula, without definite surrounding inflammatory changes to suggest an acute diverticulitis at this time. Normal appendix. Trace amount of free fluid in the low right pericolic gutter, presumably reactive from the process in the right kidney. Uterus appears enlarged and there are at least two uterine lesions, one of which appears centered in the fundus encroaching upon the endometrial canal, and likely represent a large submucosal fibroid, measuring approximately 6.3 x 5.9 x 6.3 cm. Enlarged uterus results in mass effect upon the adjacent urinary bladder which is displaced downward. 3.2 cm low attenuation lesion in the right ovary is simple in appearance, favored to  represent an ovarian  cyst. Left ovary is unremarkable in appearance. Musculoskeletal: There are no aggressive appearing lytic or blastic lesions noted in the visualized portions of the skeleton. IMPRESSION: 1. The appearance of the right kidney is strongly suggestive of acute pyelonephritis. Clinical correlation and urinalysis is recommended. 2. Trace volume of free fluid in the right paracolic gutter is presumably reactive. 3. 1.5 x 1.2 cm well defined low attenuation lesion in the head of the pancreas is incompletely characterized on today's examination. This may simply represent a small pancreatic pseudocyst, but warrants further characterization with non emergent MRI of the abdomen with and without gadolinium to exclude a small cystic pancreatic neoplasm. 3. Colonic diverticulosis without definite findings to suggest acute diverticulitis at this time. 4. Small right ovarian cyst. 5. Fibroid uterus, as above.   Assessment & Plan:  Will follow up pap smear and endometrial biopsy Pelvic ultrasound ordered Will follow up ultrasound and decide about appropriate modality for hysterectomy (TAH vs RATH) and prophylactic bilateral salpingectomy. No indication for oophorectomy.  For now, will schedule for TAH, BS.  The risks of surgery were discussed in detail with the patient including but not limited to: bleeding which may require transfusion or reoperation; infection which may require antibiotics; injury to bowel, bladder, ureters or other surrounding organs; need for additional procedures including laparotomy; thromboembolic phenomenon, incisional problems and other postoperative/anesthesia complications.  Patient was also advised that she will remain in house for 1-2 days; and expected recovery time after a hysterectomy is 6-8 weeks.  Likelihood of success in alleviating the patient's symptoms was discussed. Routine postoperative instructions will be reviewed with the patient and her family in detail after  surgery.  She was told that she will be contacted by our surgical scheduler regarding the time and date of her surgery; routine preoperative instructions of having nothing to eat or drink after midnight on the day prior to surgery and also coming to the hospital 1 1/2 hours prior to her time of surgery were also emphasized.  She was told she may be called for a preoperative appointment about a week prior to surgery and will be given further preoperative instructions at that visit. In the meantime, she was prescribed Megace and Megestrol; bleeding precautions were reviewed. Printed patient education handouts about the procedure was given to the patient to review at home. Routine preventative health maintenance measures emphasized.    Verita Schneiders, MD, [facility] Attending [place], [place]

## 2014-02-12 ENCOUNTER — Telehealth: Payer: Self-pay | Admitting: *Deleted

## 2014-02-12 NOTE — Addendum Note (Signed)
Addended by: Verita Schneiders A on: 02/12/2014 04:32 PM   Modules accepted: Orders

## 2014-02-12 NOTE — Telephone Encounter (Signed)
Message copied by Sue Lush on Mon Feb 12, 2014  3:46 PM ------      Message from: Verita Schneiders A      Created: Mon Feb 12, 2014  3:38 PM       Benign endometrial biopsy. Please call to inform patient of results. ------

## 2014-02-12 NOTE — Telephone Encounter (Signed)
Spoke with patient and informed of results, pt verbalizes understanding.

## 2014-02-14 ENCOUNTER — Encounter: Payer: Self-pay | Admitting: *Deleted

## 2014-02-20 ENCOUNTER — Ambulatory Visit (HOSPITAL_COMMUNITY)
Admission: RE | Admit: 2014-02-20 | Discharge: 2014-02-20 | Disposition: A | Discharge status: Home / Self Care | Payer: BC Managed Care – PPO | Source: Ambulatory Visit | Attending: Obstetrics & Gynecology | Admitting: Obstetrics & Gynecology

## 2014-02-20 DIAGNOSIS — D219 Benign neoplasm of connective and other soft tissue, unspecified: Secondary | ICD-10-CM

## 2014-02-20 DIAGNOSIS — N852 Hypertrophy of uterus: Secondary | ICD-10-CM | POA: Insufficient documentation

## 2014-02-20 DIAGNOSIS — D259 Leiomyoma of uterus, unspecified: Secondary | ICD-10-CM | POA: Insufficient documentation

## 2014-02-20 DIAGNOSIS — N920 Excessive and frequent menstruation with regular cycle: Secondary | ICD-10-CM | POA: Insufficient documentation

## 2014-02-21 ENCOUNTER — Telehealth: Payer: Self-pay

## 2014-02-21 NOTE — Telephone Encounter (Signed)
Message copied by Geanie Logan on Wed Feb 21, 2014  7:58 AM ------      Message from: Verita Schneiders A      Created: Tue Feb 20, 2014  5:03 PM       Enlarged uterus with 7 cm central fibroid.  She will called by surgical scheduler about time and date of hysterectomy. Still discussing with Dr. Ihor Dow to see if robotic hysterectomy is a good option, but will be scheduled for open hysterectomy for now.  She will need to come back to clinic 1-2 weeks prior to surgery. ------

## 2014-02-21 NOTE — Telephone Encounter (Signed)
Called pt. And informed her of fibroid size and the need for hysterectomy. Discussed with pt. That at this time the plan is to do a open hysterectomy but that Dr. Harolyn Rutherford is discussing the possibility of robotic hysterectomy with another surgeon. Pt. Verbalized understanding. Discussed with her that the surgical scheduler will call her with date and time of surgery and that she should expect to have an appointment with [place] 1-2 weeks prior to surgery. Informed pt. That if she gets the date for surgery and surgery is approaching and she has not heard from [place] about an appointment to give [place] a call. Pt. Verbalized understanding and stated she certainly would and had no other questions or concerns.

## 2014-03-14 ENCOUNTER — Encounter: Payer: Self-pay | Admitting: Obstetrics & Gynecology

## 2014-03-14 ENCOUNTER — Ambulatory Visit (INDEPENDENT_AMBULATORY_CARE_PROVIDER_SITE_OTHER): Payer: BC Managed Care – PPO | Admitting: Obstetrics & Gynecology

## 2014-03-14 VITALS — BP 163/100 | HR 91 | Temp 99.3°F | Resp 20 | Ht 65.0 in | Wt 195.3 lb

## 2014-03-14 DIAGNOSIS — D219 Benign neoplasm of connective and other soft tissue, unspecified: Secondary | ICD-10-CM

## 2014-03-14 DIAGNOSIS — D259 Leiomyoma of uterus, unspecified: Secondary | ICD-10-CM

## 2014-03-14 DIAGNOSIS — N920 Excessive and frequent menstruation with regular cycle: Secondary | ICD-10-CM

## 2014-03-14 MED ORDER — MEGESTROL ACETATE 40 MG PO TABS: 80.0000 mg | ORAL_TABLET | Freq: Two times a day (BID) | ORAL | Status: DC

## 2014-03-14 NOTE — Progress Notes (Signed)
Patient ID: Shawna Rodgers, female   DOB: Feb 03, 1961, 53 y.o.   MRN: [id number] Patient desires surgical management with Robot assisted total laparoscopic hysterectomy with bilateral salpingectomy.   She was previously evaluated for hyst by Dr. Harolyn Rutherford.  She is scheduled for a TAH but, wants to change it to a RATH.  The risks of surgery were discussed in detail with the patient including but not limited to: bleeding which may require transfusion or reoperation; infection which may require prolonged hospitalization or re-hospitalization and antibiotic therapy; injury to bowel, bladder, ureters and major vessels or other surrounding organs; need for additional procedures including laparotomy; thromboembolic phenomenon, incisional problems and other postoperative or anesthesia complications.  Patient was told that the likelihood that her condition and symptoms will be treated effectively with this surgical management was very high; the postoperative expectations were also discussed in detail. The patient also understands the alternative treatment options which were discussed in full. All questions were answered.  She was told that she will be contacted by our surgical scheduler regarding the time and date of her surgery; routine preoperative instructions of having nothing to eat or drink after midnight on the day prior to surgery and also coming to the hospital 1 1/2 hours prior to her time of surgery were also emphasized.  She was told she may be called for a preoperative appointment about a week prior to surgery and will be given further preoperative instructions at that visit. Printed patient education handouts about the procedure were given to the patient to review at home.  Megace 80mg  bid Pt to f/u 2 weeks prior to surgery for pre op appt.

## 2014-04-09 ENCOUNTER — Ambulatory Visit: Payer: BC Managed Care – PPO | Admitting: Family Medicine

## 2014-05-17 ENCOUNTER — Encounter (HOSPITAL_COMMUNITY): Payer: Self-pay | Admitting: Pharmacist

## 2014-05-22 ENCOUNTER — Encounter (HOSPITAL_COMMUNITY): Payer: Self-pay

## 2014-05-22 ENCOUNTER — Encounter (HOSPITAL_COMMUNITY)
Admission: RE | Admit: 2014-05-22 | Discharge: 2014-05-22 | Disposition: A | Discharge status: Home / Self Care | Payer: BC Managed Care – PPO | Source: Ambulatory Visit | Attending: Obstetrics & Gynecology | Admitting: Obstetrics & Gynecology

## 2014-05-22 ENCOUNTER — Inpatient Hospital Stay (HOSPITAL_COMMUNITY): Admission: RE | Admit: 2014-05-22 | Payer: BC Managed Care – PPO | Source: Ambulatory Visit

## 2014-05-22 LAB — CBC
HCT: 35.7 % — ABNORMAL LOW (ref 36.0–46.0)
HEMOGLOBIN: 11.1 g/dL — AB (ref 12.0–15.0)
MCH: 28 pg (ref 26.0–34.0)
MCHC: 31.1 g/dL (ref 30.0–36.0)
MCV: 89.9 fL (ref 78.0–100.0)
Platelets: 310 10*3/uL (ref 150–400)
RBC: 3.97 MIL/uL (ref 3.87–5.11)
RDW: 22.4 % — ABNORMAL HIGH (ref 11.5–15.5)
WBC: 7.4 10*3/uL (ref 4.0–10.5)

## 2014-05-22 LAB — TYPE AND SCREEN
ABO/RH(D): O POS
Antibody Screen: NEGATIVE

## 2014-05-22 LAB — ABO/RH: ABO/RH(D): O POS

## 2014-05-22 NOTE — Patient Instructions (Signed)
[place]  05/22/2014   Your procedure is scheduled on:  05/24/14  Enter through the Main Entrance of [facility] [place] up the phone at the desk and dial [phone].   Call this number if you have problems the morning of surgery: [phone]   Remember:   Do not eat food:After Midnight.  Do not drink clear liquids: After Midnight.  Take these medicines the morning of surgery with A SIP OF WATER: Bring inhaler   Do not wear jewelry, make-up or nail polish.  Do not wear lotions, powders, or perfumes. You may wear deodorant.  Do not shave 48 hours prior to surgery.  Do not bring valuables to the hospital.  [facility] is not   responsible for any belongings or valuables brought to the hospital.  Contacts, dentures or bridgework may not be worn into surgery.  Leave suitcase in the car. After surgery it may be brought to your room.  For patients admitted to the hospital, checkout time is 11:00 AM the day of              discharge.   Patients discharged the day of surgery will not be allowed to drive             home.  Name and phone number of your driver: NA  Special Instructions:      Please read over the following fact sheets that you were given:   Surgical Site Infection Prevention

## 2014-05-23 MED ORDER — DEXTROSE 5 % IV SOLN
2.0000 g | INTRAVENOUS | Status: AC
  Administered 2014-05-24: 2 g via INTRAVENOUS
  Filled 2014-05-23: qty 2

## 2014-05-23 NOTE — H&P (Signed)
History:   53 y.o. Shawna Rodgers here today for annual exam and surgical consultation for fibroid uterus and menorrhagia. She reports using multiple pads and tampons and sees large blood clots. She also complains of associated moderate abdominal cramping. Also increased urinary frequency due to fibroids. She wants a hysterectomy.   Past Medical History  Diagnosis Date  . Asthma   . Chronic bronchitis   . History of blood transfusion 08/07/2013    "first one I've ever had was today" (08/07/2013)  . Anemia    Past Surgical History  Procedure Laterality Date  . No past surgeries     No current facility-administered medications on file prior to encounter.   Current Outpatient Prescriptions on File Prior to Encounter  Medication Sig Dispense Refill  . albuterol (PROVENTIL HFA;VENTOLIN HFA) 108 (90 BASE) MCG/ACT inhaler Inhale 2 puffs into the lungs every 6 (six) hours as needed for wheezing or shortness of breath.      . meloxicam (MOBIC) 15 MG tablet Take 1 tablet (15 mg total) by mouth 2 (two) times daily as needed for pain.  60 tablet  3  No Known Allergies Family History  Problem Relation Age of Onset  . Breast cancer Mother   . Diabetes Mellitus II Father    History   Social History  . Marital Status: Single    Spouse Name: N/A    Number of Children: N/A  . Years of Education: N/A   Occupational History  . Not on file.   Social History Main Topics  . Smoking status: Former Smoker -- 0.50 packs/day for 32 years    Types: Cigarettes    Quit date: 02/14/2014  . Smokeless tobacco: Former Systems developer    Quit date: 02/14/2014  . Alcohol Use: Yes     Comment: 08/07/2013 "holidays, birthdays I'll have a mixed drink or glass of wine"  . Drug Use: No  . Sexual Activity: Yes    Birth Control/ Protection: None   Other Topics Concern  . Not on file   Social History Narrative  . No narrative on file        Review of Systems:   Pertinent items are noted in HPI.  Objective:   Physical  Exam  BP 133/82  Pulse 81  Temp(Src) 98.6 F (37 C) (Oral)  Resp 20  SpO2 99%   GENERAL: Well-developed, well-nourished female in no acute distress.  HEENT: Normocephalic, atraumatic. Sclerae anicteric.  NECK: Supple. Normal thyroid.  LUNGS: Clear to auscultation bilaterally.  HEART: Regular rate and rhythm.  BREASTS: Symmetric in size. No masses, skin changes, nipple drainage, or lymphadenopathy.  ABDOMEN: Soft, nontender, nondistended. No organomegaly.  PELVIC: Normal external female genitalia. Vagina is pink and rugated. Normal discharge. Normal cervix contour. Pap smear obtained. Uterus is enlarged, about 16 week size. No adnexal mass or tenderness.  EXTREMITIES: No cyanosis, clubbing, or edema, 2+ distal pulses  CBC    Component Value Date/Time   WBC 7.4 05/22/2014 1452   RBC 3.97 05/22/2014 1452   RBC 3.33* 08/07/2013 0125   HGB 11.1* 05/22/2014 1452   HCT 35.7* 05/22/2014 1452   PLT 310 05/22/2014 1452   MCV 89.9 05/22/2014 1452   MCH 28.0 05/22/2014 1452   MCHC 31.1 05/22/2014 1452   RDW 22.4* 05/22/2014 1452   LYMPHSABS 2.8 12/06/2013 1403   MONOABS 0.6 12/06/2013 1403   EOSABS 0.3 12/06/2013 1403   BASOSABS 0.0 12/06/2013 1403     Labs and Imaging  08/07/2013 CT ABDOMEN AND  PELVIS WITH CONTRAST Clinical Data: Abdominal pain and discomfort. Fever and chills. Contrast: 156mL OMNIPAQUE IOHEXOL 300 MG/ML SOLN Comparison: No priors. Findings: Lung Bases: Unremarkable.  Abdomen/Pelvis: The appearance of the liver, gallbladder, spleen, bilateral adrenal glands of the left kidney is unremarkable. The right kidney demonstrates heterogeneous decreased enhancement, with a somewhat striated appearance, concerning for pyelonephritis. There is perinephric stranding around the right kidney. Within the head of the pancreas there is a 1.5 x 1.2 cm low attenuation lesion. The remainder the pancreas is otherwise unremarkable in appearance. No significant volume of ascites. No pneumoperitoneum. No  pathologic distension of small bowel. There are numerous colonic diverticula, without definite surrounding inflammatory changes to suggest an acute diverticulitis at this time. Normal appendix. Trace amount of free fluid in the low right pericolic gutter, presumably reactive from the process in the right kidney. Uterus appears enlarged and there are at least two uterine lesions, one of which appears centered in the fundus encroaching upon the endometrial canal, and likely represent a large submucosal fibroid, measuring approximately 6.3 x 5.9 x 6.3 cm. Enlarged uterus results in mass effect upon the adjacent urinary bladder which is displaced downward. 3.2 cm low attenuation lesion in the right ovary is simple in appearance, favored to represent an ovarian cyst. Left ovary is unremarkable in appearance. Musculoskeletal: There are no aggressive appearing lytic or blastic lesions noted in the visualized portions of the skeleton. IMPRESSION: 1. The appearance of the right kidney is strongly suggestive of acute pyelonephritis. Clinical correlation and urinalysis is recommended. 2. Trace volume of free fluid in the right paracolic gutter is presumably reactive. 3. 1.5 x 1.2 cm well defined low attenuation lesion in the head of the pancreas is incompletely characterized on today's examination. This may simply represent a small pancreatic pseudocyst, but warrants further characterization with non emergent MRI of the abdomen with and without gadolinium to exclude a small cystic pancreatic neoplasm. 3. Colonic diverticulosis without definite findings to suggest acute diverticulitis at this time. 4. Small right ovarian cyst. 5. Fibroid uterus, as above.  02/09/2014 Diagnosis Endometrium, biopsy - SECRETORY ENDOMETRIUM AND ABUNDANT BLOOD. - NO HYPERPLASIA OR CARCINOMA  Assessment & Plan:   Patient desires surgical management with robot assisted total laparoscopic hysterectomy with bilateral sal[pingectomy.  The risks of  surgery were discussed in detail with the patient including but not limited to: bleeding which may require transfusion or reoperation; infection which may require prolonged hospitalization or re-hospitalization and antibiotic therapy; injury to bowel, bladder, ureters and major vessels or other surrounding organs; need for additional procedures including laparotomy; thromboembolic phenomenon, incisional problems and other postoperative or anesthesia complications.  Patient was told that the likelihood that her condition and symptoms will be treated effectively with this surgical management was very high; the postoperative expectations were also discussed in detail. The patient also understands the alternative treatment options which were discussed in full. All questions were answered.

## 2014-05-24 ENCOUNTER — Inpatient Hospital Stay (HOSPITAL_COMMUNITY): Payer: BC Managed Care – PPO | Admitting: Anesthesiology

## 2014-05-24 ENCOUNTER — Encounter (HOSPITAL_COMMUNITY): Payer: Self-pay

## 2014-05-24 ENCOUNTER — Encounter (HOSPITAL_COMMUNITY)
Admission: RE | Disposition: A | Discharge status: Home / Self Care | Payer: Self-pay | Source: Ambulatory Visit | Attending: Obstetrics & Gynecology

## 2014-05-24 ENCOUNTER — Encounter (HOSPITAL_COMMUNITY): Payer: BC Managed Care – PPO | Admitting: Anesthesiology

## 2014-05-24 ENCOUNTER — Observation Stay (HOSPITAL_COMMUNITY)
Admission: RE | Admit: 2014-05-24 | Discharge: 2014-05-25 | Disposition: A | Discharge status: Home / Self Care | Payer: BC Managed Care – PPO | Source: Ambulatory Visit | Attending: Obstetrics & Gynecology | Admitting: Obstetrics & Gynecology

## 2014-05-24 DIAGNOSIS — J42 Unspecified chronic bronchitis: Secondary | ICD-10-CM | POA: Insufficient documentation

## 2014-05-24 DIAGNOSIS — Z01812 Encounter for preprocedural laboratory examination: Secondary | ICD-10-CM | POA: Insufficient documentation

## 2014-05-24 DIAGNOSIS — Z87891 Personal history of nicotine dependence: Secondary | ICD-10-CM | POA: Insufficient documentation

## 2014-05-24 DIAGNOSIS — Z538 Procedure and treatment not carried out for other reasons: Secondary | ICD-10-CM | POA: Insufficient documentation

## 2014-05-24 DIAGNOSIS — Z9889 Other specified postprocedural states: Secondary | ICD-10-CM

## 2014-05-24 DIAGNOSIS — D500 Iron deficiency anemia secondary to blood loss (chronic): Secondary | ICD-10-CM

## 2014-05-24 DIAGNOSIS — D250 Submucous leiomyoma of uterus: Principal | ICD-10-CM | POA: Insufficient documentation

## 2014-05-24 DIAGNOSIS — Z5331 Laparoscopic surgical procedure converted to open procedure: Secondary | ICD-10-CM | POA: Insufficient documentation

## 2014-05-24 DIAGNOSIS — D259 Leiomyoma of uterus, unspecified: Secondary | ICD-10-CM

## 2014-05-24 DIAGNOSIS — D649 Anemia, unspecified: Secondary | ICD-10-CM | POA: Insufficient documentation

## 2014-05-24 DIAGNOSIS — R350 Frequency of micturition: Secondary | ICD-10-CM | POA: Insufficient documentation

## 2014-05-24 DIAGNOSIS — N924 Excessive bleeding in the premenopausal period: Secondary | ICD-10-CM | POA: Insufficient documentation

## 2014-05-24 DIAGNOSIS — J45909 Unspecified asthma, uncomplicated: Secondary | ICD-10-CM | POA: Insufficient documentation

## 2014-05-24 HISTORY — PX: ROBOTIC ASSISTED TOTAL HYSTERECTOMY: SHX6085

## 2014-05-24 HISTORY — PX: CYSTOSCOPY: SHX5120

## 2014-05-24 HISTORY — PX: VAGINAL HYSTERECTOMY: SHX2639

## 2014-05-24 LAB — PREGNANCY, URINE: Preg Test, Ur: NEGATIVE

## 2014-05-24 SURGERY — ROBOTIC ASSISTED TOTAL HYSTERECTOMY
Anesthesia: General | Site: Vagina

## 2014-05-24 MED ORDER — DEXTROSE-NACL 5-0.45 % IV SOLN
INTRAVENOUS | Status: DC
  Administered 2014-05-24: 21:00:00 via INTRAVENOUS

## 2014-05-24 MED ORDER — ARTIFICIAL TEARS OP OINT
TOPICAL_OINTMENT | OPHTHALMIC | Status: DC | PRN
  Administered 2014-05-24: 1 via OPHTHALMIC

## 2014-05-24 MED ORDER — HYDROMORPHONE HCL PF 1 MG/ML IJ SOLN
0.2500 mg | INTRAMUSCULAR | Status: DC | PRN
Start: 2014-05-24 — End: 2014-05-24
  Administered 2014-05-24 (×2): 0.5 mg via INTRAVENOUS

## 2014-05-24 MED ORDER — HYDROMORPHONE HCL PF 1 MG/ML IJ SOLN
INTRAMUSCULAR | Status: AC
  Administered 2014-05-24: 0.5 mg
  Filled 2014-05-24: qty 1

## 2014-05-24 MED ORDER — METHYLENE BLUE 1 % INJ SOLN
INTRAMUSCULAR | Status: AC
  Filled 2014-05-24: qty 10

## 2014-05-24 MED ORDER — OXYCODONE-ACETAMINOPHEN 5-325 MG PO TABS: 1.0000 | ORAL_TABLET | ORAL | Status: DC | PRN

## 2014-05-24 MED ORDER — ALBUTEROL SULFATE HFA 108 (90 BASE) MCG/ACT IN AERS
INHALATION_SPRAY | RESPIRATORY_TRACT | Status: AC
  Filled 2014-05-24: qty 6.7

## 2014-05-24 MED ORDER — ALBUTEROL SULFATE (2.5 MG/3ML) 0.083% IN NEBU: 3.0000 mL | INHALATION_SOLUTION | Freq: Four times a day (QID) | RESPIRATORY_TRACT | Status: DC | PRN

## 2014-05-24 MED ORDER — NEOSTIGMINE METHYLSULFATE 10 MG/10ML IV SOLN
INTRAVENOUS | Status: DC | PRN
  Administered 2014-05-24: 4 mg via INTRAVENOUS

## 2014-05-24 MED ORDER — DEXTROSE-NACL 5-0.45 % IV SOLN: INTRAVENOUS | Status: DC

## 2014-05-24 MED ORDER — FENTANYL CITRATE 0.05 MG/ML IJ SOLN
INTRAMUSCULAR | Status: AC
  Filled 2014-05-24: qty 5

## 2014-05-24 MED ORDER — LACTATED RINGERS IV SOLN
INTRAVENOUS | Status: DC
  Administered 2014-05-24: 13:00:00 via INTRAVENOUS

## 2014-05-24 MED ORDER — MIDAZOLAM HCL 2 MG/2ML IJ SOLN
INTRAMUSCULAR | Status: DC | PRN
  Administered 2014-05-24: 2 mg via INTRAVENOUS

## 2014-05-24 MED ORDER — SIMETHICONE 80 MG PO CHEW
80.0000 mg | CHEWABLE_TABLET | Freq: Four times a day (QID) | ORAL | Status: DC | PRN
  Filled 2014-05-24: qty 1

## 2014-05-24 MED ORDER — MAGNESIUM CITRATE PO SOLN
1.0000 | Freq: Once | ORAL | Status: AC | PRN
  Filled 2014-05-24: qty 296

## 2014-05-24 MED ORDER — PANTOPRAZOLE SODIUM 40 MG IV SOLR
40.0000 mg | Freq: Every day | INTRAVENOUS | Status: DC
  Administered 2014-05-24: 40 mg via INTRAVENOUS
  Filled 2014-05-24: qty 40

## 2014-05-24 MED ORDER — DEXTROSE 5 % IV SOLN: 2.0000 g | Freq: Once | INTRAVENOUS | Status: DC

## 2014-05-24 MED ORDER — LACTATED RINGERS IV SOLN
INTRAVENOUS | Status: DC
  Administered 2014-05-24 (×4): via INTRAVENOUS

## 2014-05-24 MED ORDER — IBUPROFEN 800 MG PO TABS
800.0000 mg | ORAL_TABLET | Freq: Three times a day (TID) | ORAL | Status: DC | PRN
  Administered 2014-05-25: 800 mg via ORAL
  Filled 2014-05-24: qty 1

## 2014-05-24 MED ORDER — LACTATED RINGERS IR SOLN
Status: DC | PRN
  Administered 2014-05-24: 3000 mL

## 2014-05-24 MED ORDER — PROPOFOL 10 MG/ML IV EMUL
INTRAVENOUS | Status: AC
  Filled 2014-05-24: qty 20

## 2014-05-24 MED ORDER — FENTANYL CITRATE 0.05 MG/ML IJ SOLN
INTRAMUSCULAR | Status: AC
  Filled 2014-05-24: qty 2

## 2014-05-24 MED ORDER — KETOROLAC TROMETHAMINE 30 MG/ML IJ SOLN: 30.0000 mg | Freq: Four times a day (QID) | INTRAMUSCULAR | Status: DC

## 2014-05-24 MED ORDER — FENTANYL CITRATE 0.05 MG/ML IJ SOLN
INTRAMUSCULAR | Status: DC | PRN
  Administered 2014-05-24: 50 ug via INTRAVENOUS
  Administered 2014-05-24: 100 ug via INTRAVENOUS
  Administered 2014-05-24: 50 ug via INTRAVENOUS
  Administered 2014-05-24: 100 ug via INTRAVENOUS
  Administered 2014-05-24: 50 ug via INTRAVENOUS

## 2014-05-24 MED ORDER — HYDROMORPHONE HCL PF 1 MG/ML IJ SOLN
INTRAMUSCULAR | Status: AC
  Filled 2014-05-24: qty 1

## 2014-05-24 MED ORDER — ONDANSETRON HCL 4 MG/2ML IJ SOLN
INTRAMUSCULAR | Status: AC
  Filled 2014-05-24: qty 2

## 2014-05-24 MED ORDER — STERILE WATER FOR IRRIGATION IR SOLN
Status: DC | PRN
  Administered 2014-05-24: 1000 mL

## 2014-05-24 MED ORDER — ACETAMINOPHEN 10 MG/ML IV SOLN
1000.0000 mg | Freq: Once | INTRAVENOUS | Status: AC
  Administered 2014-05-24: 1000 mg via INTRAVENOUS
  Filled 2014-05-24: qty 100

## 2014-05-24 MED ORDER — METHYLENE BLUE 1 % INJ SOLN
INTRAMUSCULAR | Status: DC | PRN
  Administered 2014-05-24: 50 mg via INTRAVENOUS

## 2014-05-24 MED ORDER — GLYCOPYRROLATE 0.2 MG/ML IJ SOLN
INTRAMUSCULAR | Status: DC | PRN
Start: 2014-05-24 — End: 2014-05-24
  Administered 2014-05-24: 0.6 mg via INTRAVENOUS

## 2014-05-24 MED ORDER — PHENYLEPHRINE HCL 10 MG/ML IJ SOLN
INTRAMUSCULAR | Status: DC | PRN
  Administered 2014-05-24: 80 ug via INTRAVENOUS

## 2014-05-24 MED ORDER — BUPIVACAINE HCL (PF) 0.5 % IJ SOLN
INTRAMUSCULAR | Status: AC
  Filled 2014-05-24: qty 30

## 2014-05-24 MED ORDER — BISACODYL 10 MG RE SUPP
10.0000 mg | Freq: Every day | RECTAL | Status: DC | PRN
  Filled 2014-05-24: qty 1

## 2014-05-24 MED ORDER — ONDANSETRON HCL 4 MG PO TABS: 4.0000 mg | ORAL_TABLET | Freq: Four times a day (QID) | ORAL | Status: DC | PRN

## 2014-05-24 MED ORDER — LIDOCAINE HCL (CARDIAC) 20 MG/ML IV SOLN
INTRAVENOUS | Status: DC | PRN
  Administered 2014-05-24: 100 mg via INTRAVENOUS

## 2014-05-24 MED ORDER — PROPOFOL 10 MG/ML IV BOLUS
INTRAVENOUS | Status: DC | PRN
  Administered 2014-05-24: 200 mg via INTRAVENOUS

## 2014-05-24 MED ORDER — ROCURONIUM BROMIDE 100 MG/10ML IV SOLN
INTRAVENOUS | Status: AC
  Filled 2014-05-24: qty 1

## 2014-05-24 MED ORDER — PROMETHAZINE HCL 25 MG/ML IJ SOLN: 6.2500 mg | INTRAMUSCULAR | Status: DC | PRN

## 2014-05-24 MED ORDER — DOCUSATE SODIUM 100 MG PO CAPS
100.0000 mg | ORAL_CAPSULE | Freq: Two times a day (BID) | ORAL | Status: DC
Start: 2014-05-24 — End: 2014-05-25
  Administered 2014-05-24 – 2014-05-25 (×2): 100 mg via ORAL
  Filled 2014-05-24 (×6): qty 1

## 2014-05-24 MED ORDER — GLYCOPYRROLATE 0.2 MG/ML IJ SOLN
INTRAMUSCULAR | Status: AC
  Filled 2014-05-24: qty 3

## 2014-05-24 MED ORDER — LIDOCAINE HCL (CARDIAC) 20 MG/ML IV SOLN
INTRAVENOUS | Status: AC
Start: 2014-05-24 — End: 2014-05-24
  Filled 2014-05-24: qty 5

## 2014-05-24 MED ORDER — BUPIVACAINE HCL (PF) 0.5 % IJ SOLN
INTRAMUSCULAR | Status: DC | PRN
  Administered 2014-05-24: 30 mL

## 2014-05-24 MED ORDER — ONDANSETRON HCL 4 MG/2ML IJ SOLN
INTRAMUSCULAR | Status: DC | PRN
  Administered 2014-05-24: 4 mg via INTRAVENOUS

## 2014-05-24 MED ORDER — MIDAZOLAM HCL 2 MG/2ML IJ SOLN
INTRAMUSCULAR | Status: AC
Start: 2014-05-24 — End: 2014-05-24
  Filled 2014-05-24: qty 2

## 2014-05-24 MED ORDER — ONDANSETRON HCL 4 MG/2ML IJ SOLN: 4.0000 mg | Freq: Four times a day (QID) | INTRAMUSCULAR | Status: DC | PRN

## 2014-05-24 MED ORDER — ROCURONIUM BROMIDE 100 MG/10ML IV SOLN
INTRAVENOUS | Status: DC | PRN
  Administered 2014-05-24: 50 mg via INTRAVENOUS

## 2014-05-24 MED ORDER — NEOSTIGMINE METHYLSULFATE 10 MG/10ML IV SOLN
INTRAVENOUS | Status: AC
  Filled 2014-05-24: qty 1

## 2014-05-24 MED ORDER — MEPERIDINE HCL 25 MG/ML IJ SOLN
6.2500 mg | INTRAMUSCULAR | Status: DC | PRN
Start: 2014-05-24 — End: 2014-05-24

## 2014-05-24 MED ORDER — MORPHINE SULFATE 4 MG/ML IJ SOLN
1.0000 mg | INTRAMUSCULAR | Status: DC | PRN
  Administered 2014-05-24 (×3): 2 mg via INTRAVENOUS
  Filled 2014-05-24 (×3): qty 1

## 2014-05-24 SURGICAL SUPPLY — 66 items
APPLIER CLIP 5 13 M/L LIGAMAX5 (MISCELLANEOUS)
BARRIER ADHS 3X4 INTERCEED (GAUZE/BANDAGES/DRESSINGS) IMPLANT
BENZOIN TINCTURE PRP APPL 2/3 (GAUZE/BANDAGES/DRESSINGS) ×4 IMPLANT
CATH FOLEY 3WAY  5CC 16FR (CATHETERS) ×1
CATH FOLEY 3WAY 5CC 16FR (CATHETERS) ×3 IMPLANT
CLIP APPLIE 5 13 M/L LIGAMAX5 (MISCELLANEOUS) IMPLANT
CLOTH BEACON ORANGE TIMEOUT ST (SAFETY) ×4 IMPLANT
CONT PATH 16OZ SNAP LID 3702 (MISCELLANEOUS) ×4 IMPLANT
COVER MAYO STAND STRL (DRAPES) ×4 IMPLANT
COVER TABLE BACK 60X90 (DRAPES) ×8 IMPLANT
COVER TIP SHEARS 8 DVNC (MISCELLANEOUS) ×3 IMPLANT
COVER TIP SHEARS 8MM DA VINCI (MISCELLANEOUS) ×1
DECANTER SPIKE VIAL GLASS SM (MISCELLANEOUS) ×4 IMPLANT
DERMABOND ADVANCED (GAUZE/BANDAGES/DRESSINGS) ×1
DERMABOND ADVANCED .7 DNX12 (GAUZE/BANDAGES/DRESSINGS) ×3 IMPLANT
DEVICE TROCAR PUNCTURE CLOSURE (ENDOMECHANICALS) IMPLANT
DRAPE HUG U DISPOSABLE (DRAPE) ×4 IMPLANT
DRAPE WARM FLUID 44X44 (DRAPE) ×4 IMPLANT
DRSG COVADERM PLUS 2X2 (GAUZE/BANDAGES/DRESSINGS) ×4 IMPLANT
DURAPREP 26ML APPLICATOR (WOUND CARE) ×4 IMPLANT
ELECT REM PT RETURN 9FT ADLT (ELECTROSURGICAL) ×4
ELECTRODE REM PT RTRN 9FT ADLT (ELECTROSURGICAL) ×3 IMPLANT
EVACUATOR SMOKE 8.L (FILTER) ×4 IMPLANT
GAUZE VASELINE 3X9 (GAUZE/BANDAGES/DRESSINGS) IMPLANT
GLOVE BIO SURGEON STRL SZ7 (GLOVE) ×8 IMPLANT
GLOVE BIOGEL PI IND STRL 7.0 (GLOVE) ×12 IMPLANT
GLOVE BIOGEL PI INDICATOR 7.0 (GLOVE) ×4
GOWN STRL REUS W/TWL LRG LVL3 (GOWN DISPOSABLE) ×40 IMPLANT
KIT ACCESSORY DA VINCI DISP (KITS) ×1
KIT ACCESSORY DVNC DISP (KITS) ×3 IMPLANT
LEGGING LITHOTOMY PAIR STRL (DRAPES) ×4 IMPLANT
NEEDLE INSUFFLATION 120MM (ENDOMECHANICALS) ×4 IMPLANT
OCCLUDER COLPOPNEUMO (BALLOONS) ×4 IMPLANT
PACK ROBOT WH (CUSTOM PROCEDURE TRAY) ×4 IMPLANT
PAD PREP 24X48 CUFFED NSTRL (MISCELLANEOUS) ×8 IMPLANT
PROTECTOR NERVE ULNAR (MISCELLANEOUS) ×8 IMPLANT
SET CYSTO W/LG BORE CLAMP LF (SET/KITS/TRAYS/PACK) IMPLANT
SET IRRIG TUBING LAPAROSCOPIC (IRRIGATION / IRRIGATOR) ×4 IMPLANT
SHEARS HARMONIC ACE PLUS 36CM (ENDOMECHANICALS) ×4 IMPLANT
STRIP CLOSURE SKIN 1/2X4 (GAUZE/BANDAGES/DRESSINGS) ×4 IMPLANT
SURGIFLO W/THROMBIN 8M KIT (HEMOSTASIS) IMPLANT
SUT VIC AB 0 CT1 18XCR BRD8 (SUTURE) ×6 IMPLANT
SUT VIC AB 0 CT1 27 (SUTURE) ×2
SUT VIC AB 0 CT1 27XBRD ANBCTR (SUTURE) ×6 IMPLANT
SUT VIC AB 0 CT1 27XBRD ANTBC (SUTURE) IMPLANT
SUT VIC AB 0 CT1 36 (SUTURE) ×8 IMPLANT
SUT VIC AB 0 CT1 8-18 (SUTURE) ×2
SUT VICRYL 0 TIES 12 18 (SUTURE) ×4 IMPLANT
SUT VICRYL 0 UR6 27IN ABS (SUTURE) ×8 IMPLANT
SUT VICRYL 4-0 PS2 18IN ABS (SUTURE) ×8 IMPLANT
SUT VLOC 180 0 9IN  GS21 (SUTURE)
SUT VLOC 180 0 9IN GS21 (SUTURE) IMPLANT
SYR 50ML LL SCALE MARK (SYRINGE) ×4 IMPLANT
SYSTEM CONVERTIBLE TROCAR (TROCAR) ×4 IMPLANT
TIP RUMI ORANGE 6.7MMX12CM (TIP) ×4 IMPLANT
TIP UTERINE 5.1X6CM LAV DISP (MISCELLANEOUS) IMPLANT
TIP UTERINE 6.7X10CM GRN DISP (MISCELLANEOUS) IMPLANT
TIP UTERINE 6.7X6CM WHT DISP (MISCELLANEOUS) IMPLANT
TIP UTERINE 6.7X8CM BLUE DISP (MISCELLANEOUS) IMPLANT
TOWEL OR 17X24 6PK STRL BLUE (TOWEL DISPOSABLE) ×8 IMPLANT
TROCAR DILATING TIP 12MM 150MM (ENDOMECHANICALS) ×8 IMPLANT
TROCAR DISP BLADELESS 8 DVNC (TROCAR) ×6 IMPLANT
TROCAR DISP BLADELESS 8MM (TROCAR) ×2
TROCAR XCEL 12X100 BLDLESS (ENDOMECHANICALS) ×4 IMPLANT
TROCAR XCEL NON-BLD 5MMX100MML (ENDOMECHANICALS) ×4 IMPLANT
WATER STERILE IRR 1000ML POUR (IV SOLUTION) ×12 IMPLANT

## 2014-05-24 NOTE — Anesthesia Procedure Notes (Signed)
Procedure Name: Intubation Date/Time: 05/24/2014 7:35 AM Performed by: STERLING, Sheron Nightingale Pre-anesthesia Checklist: Patient identified, Timeout performed, Emergency Drugs available, Suction available and Patient being monitored Patient Re-evaluated:Patient Re-evaluated prior to inductionOxygen Delivery Method: Circle system utilized Preoxygenation: Pre-oxygenation with 100% oxygen Intubation Type: IV induction Ventilation: Mask ventilation without difficulty Laryngoscope Size: Mac and 3 Grade View: Grade I Tube type: Oral Tube size: 7.0 mm Number of attempts: 1 Placement Confirmation: ETT inserted through vocal cords under direct vision and breath sounds checked- equal and bilateral Secured at: 22 cm Dental Injury: Teeth and Oropharynx as per pre-operative assessment

## 2014-05-24 NOTE — Op Note (Signed)
05/24/2014  11:46 AM  PATIENT:  Shawna Rodgers  53 y.o. female  PRE-OPERATIVE DIAGNOSIS:  Symptomatic fibroid uterus and menorrhagia  POST-OPERATIVE DIAGNOSIS:  symptomatic fibroid uterus and menorrhagia  PROCEDURE:  Procedure(s) with comments: ATTEMPTED ROBOTIC ASSISTED TOTAL HYSTERECTOMY  (N/A) - wound class contaminated HYSTERECTOMY VAGINAL, (N/A) - converted to vaginal procedure at 0926 CYSTOSCOPY (N/A)  SURGEON:  Surgeon(s) and Role:    * Lavonia Drafts, MD - Primary    * Osborne Oman, MD - Assisting  ANESTHESIA:   general  EBL:  Total I/O In: 2700 [I.V.:2700] Out: 900 [Urine:300; Blood:600]  BLOOD ADMINISTERED:none  DRAINS: none   LOCAL MEDICATIONS USED:  MARCAINE     SPECIMEN:  Source of Specimen:  cervix & uterus  DISPOSITION OF SPECIMEN:  PATHOLOGY  COUNTS:  YES  TOURNIQUET:  * No tourniquets in log *  DICTATION: .Note written in EPIC  PLAN OF CARE: Admit for overnight observation  PATIENT DISPOSITION:  PACU - hemodynamically stable.   Delay start of Pharmacological VTE agent (>24hrs) due to surgical blood loss or risk of bleeding: yes  Findings: >900gram uterus with fallopian tubes and ovaries adherent to sidewalls bilaterally.  The risks, benefits, and alternatives of surgery were explained, understood, and accepted. Consents were signed. All questions were answered. She was taken to the operating room and general anesthesia was applied without complication. She was placed in the dorsal lithotomy position and her abdomen and vagina were prepped and draped after she had been carefully positioned on the table. A bimanual exam revealed a 18 week size uterus that was mobile. Her adnexa were not enlarged. The cervix was measured and the uterus was sounded to 15 cm. A Rumi uterine manipulator was placed without difficulty. A Foley catheter was placed and it drained clear throughout the case. Gloves were changed and attention was turned to the abdomen. A  Veress needle was placed in the umbilicus and placement was confirmed with a syringe. CO2 was used to insufflate the abdomen to approximately 5 L. After good pneumoperitoneum was established A 92mm incision was made 6 cm above the umbilicus. A 12 mm trocar was placed in the umbilicus.  Laparoscopy confirmed correct placement. She was placed in Trendelenburg position and ports were placed in appropriate positions on her abdomen to allow maximum exposure during the robotic case. Specifically there was an 109mm assistant port placed in the left lower quadrant under direct laproscopic visualization. Two 8 mm ports were placed 8cm lateral to the midline port.  These were all placed under direct laparoscopic visualization. The robot was docked and I proceeded with a robotic portion of the case.  The pelvis was inspected and the uterus was found to have large fibroids and be enlarged.  The fallopian tubes were adherent to the side walls with the ovaries bilaterally.  Due to the adhesion the fallopian tubes were NOT removed. The remainder of her pelvis appeared normal with the exception of adhesions of of omentum to the sidewall bilaterally.  These were released sharply. The ureters and the infundibulopelvic ligaments were identified. The round ligament on each side was cauterized and cut. An attempt was made to use the Harmonic scalpel but there was a problem with the connection and the energy was weak despite 2 different instruments being used. The PK/gyrus instrument was then used for this portion. The round ligaments were identified, cauterized and ligated, a bladder flap was created anteriorly. The uterine vessels were identified and cauterized and then cut on the  left side.  Due to inability to control the position of the uterine fundus once the broad ligament was released on the left side, the right uterine artery could not reliably be clamped and a decision was made to complete the case from below. Excellent  hemostasis was noted.  A weighted speculum was then placed in the vagina, and the anterior and posterior lips of the cervix were grasped bilaterally with tenaculums.  The cervix was then circumferentially incised, and the bladder was dissected off the pubocervical fascia without complication.  The posterior cul-de-sac was entered sharply without difficulty.  A long weighted speculum was inserted into the posterior cul-de-sac.  The Heaney clamp was then used to clamp the uterosacral ligaments on either side.  They were then cut and sutured ligated with 0 Vicryl, and the ligated uterosacral ligaments were transfixed to the posterior lateral vaginal epithelium to further support the vagina and provide hemostasis. Of note, all sutures used in this case were 0 Vicryl unless otherwise noted.   The cardinal ligaments were then clamped, cut and ligated. The anterior cul-de-sac was then entered sharpely. The uterine vessel on the right side was clamped, transected and suture ligated.  At this point the uterus was morcellated from the vagina with a cold knife using a coring technique.  The uterus was then delivered via the posterior cul-de-sac All pedicles from the uterosacral ligament to the cornua were examined hemostasis was confirmed.  The vaginal cuff was closed in multiple figure-of-eight sutures.  Hemostasis was assured.  At this point I performed cystoscopy. The cystoscopy revealed blue ejection from both ureters (methylene blue was injected by anesthesia 29min prior to cysto).  The entire bladder was inspected and found to be intact.  At this point attention was redirected to the laparoscopy portion.  The pedicles were then evaluated and the pelvis was irrigated copiously. The intraabdominal pressure was lowered assess hemostasis.All instruments were then removed from the pelvis after determining excellent hemostasis. The midline fascial incision was closed with 0 vicryl.  The skin from all of the other ports was  closed with 3-0 vicryl. 30cc of 0.5% Marcaine was injected into the port sites.  The patient was then extubated and taken to recovery in stable condition. No immediate complications noted.  Sponge, lap and needle counts were correct x 2.

## 2014-05-24 NOTE — Anesthesia Preprocedure Evaluation (Addendum)
Anesthesia Evaluation  Patient identified by MRN, date of birth, ID band Patient awake    Reviewed: Allergy & Precautions, H&P , NPO status , Patient's Chart, lab work & pertinent test results  Airway Mallampati: II TM Distance: >3 FB Neck ROM: Full    Dental no notable dental hx. (+) Missing   Pulmonary neg pulmonary ROS, asthma , former smoker,  breath sounds clear to auscultation  Pulmonary exam normal       Cardiovascular negative cardio ROS  Rhythm:Regular Rate:Normal     Neuro/Psych negative neurological ROS  negative psych ROS   GI/Hepatic negative GI ROS, Neg liver ROS,   Endo/Other  negative endocrine ROS  Renal/GU negative Renal ROS  negative genitourinary   Musculoskeletal negative musculoskeletal ROS (+)   Abdominal   Peds negative pediatric ROS (+)  Hematology negative hematology ROS (+)   Anesthesia Other Findings   Reproductive/Obstetrics negative OB ROS                          Anesthesia Physical Anesthesia Plan  ASA: II  Anesthesia Plan: General   Post-op Pain Management:    Induction: Intravenous  Airway Management Planned: Oral ETT  Additional Equipment:   Intra-op Plan:   Post-operative Plan: Extubation in OR  Informed Consent: I have reviewed the patients History and Physical, chart, labs and discussed the procedure including the risks, benefits and alternatives for the proposed anesthesia with the patient or authorized representative who has indicated his/her understanding and acceptance.   Dental advisory given  Plan Discussed with: CRNA  Anesthesia Plan Comments:         Anesthesia Quick Evaluation

## 2014-05-24 NOTE — Addendum Note (Signed)
Addendum created 05/24/14 1537 by Ignacia Bayley, CRNA   Modules edited: Notes Section   Notes Section:  File: [id number]

## 2014-05-24 NOTE — Anesthesia Postprocedure Evaluation (Signed)
  Anesthesia Post Note  Patient: Shawna Rodgers  Procedure(s) Performed: Procedure(s) (LRB): ATTEMPTED ROBOTIC ASSISTED TOTAL HYSTERECTOMY  (N/A) HYSTERECTOMY VAGINAL, (N/A) CYSTOSCOPY (N/A)  Anesthesia type: GA  Patient location: PACU  Post pain: Pain level controlled  Post assessment: Post-op Vital signs reviewed  Last Vitals:  Filed Vitals:   05/24/14 1215  BP: 114/58  Pulse: 71  Temp:   Resp: 11    Post vital signs: Reviewed  Level of consciousness: sedated  Complications: No apparent anesthesia complications

## 2014-05-24 NOTE — Transfer of Care (Signed)
Immediate Anesthesia Transfer of Care Note  Patient: Shawna Rodgers  Procedure(s) Performed: Procedure(s) with comments: ATTEMPTED ROBOTIC ASSISTED TOTAL HYSTERECTOMY  (N/A) - wound class contaminated HYSTERECTOMY VAGINAL, (N/A) - converted to vaginal procedure at 0926 CYSTOSCOPY (N/A)  Patient Location: PACU  Anesthesia Type:General  Level of Consciousness: awake, alert  and sedated  Airway & Oxygen Therapy: Patient Spontanous Breathing and Patient connected to nasal cannula oxygen  Post-op Assessment: Report given to PACU RN and Post -op Vital signs reviewed and stable  Post vital signs: Reviewed and stable  Complications: No apparent anesthesia complications

## 2014-05-24 NOTE — Anesthesia Postprocedure Evaluation (Signed)
  Anesthesia Post-op Note  Patient: Shawna Rodgers  Procedure(s) Performed: Procedure(s) with comments: ATTEMPTED ROBOTIC ASSISTED TOTAL HYSTERECTOMY  (N/A) - wound class contaminated HYSTERECTOMY VAGINAL, (N/A) - converted to vaginal procedure at 0926 CYSTOSCOPY (N/A)  Patient Location: [facility]  Anesthesia Type:General  Level of Consciousness: sedated  Airway and Oxygen Therapy: Patient Spontanous Breathing and Patient connected to nasal cannula oxygen  Post-op Pain: mild  Post-op Assessment: Patient's Cardiovascular Status Stable and Respiratory Function Stable  Post-op Vital Signs: stable  Last Vitals:  Filed Vitals:   05/24/14 1430  BP: 122/63  Pulse: 71  Temp: 36.7 C  Resp: 16    Complications: No apparent anesthesia complications

## 2014-05-24 NOTE — Brief Op Note (Signed)
05/24/2014  11:46 AM  PATIENT:  Shawna Rodgers  53 y.o. female  PRE-OPERATIVE DIAGNOSIS:  Symptomatic fibroid uterus and menorrhagia  POST-OPERATIVE DIAGNOSIS:  symptomatic fibroid uterus and menorrhagia  PROCEDURE:  Procedure(s) with comments: ATTEMPTED ROBOTIC ASSISTED TOTAL HYSTERECTOMY  (N/A) - wound class contaminated HYSTERECTOMY VAGINAL, (N/A) - converted to vaginal procedure at 0926 CYSTOSCOPY (N/A)  SURGEON:  Surgeon(s) and Role:    * Lavonia Drafts, MD - Primary    * Osborne Oman, MD - Assisting  ANESTHESIA:   general  EBL:  Total I/O In: 2700 [I.V.:2700] Out: 900 [Urine:300; Blood:600]  BLOOD ADMINISTERED:none  DRAINS: none   LOCAL MEDICATIONS USED:  MARCAINE     SPECIMEN:  Source of Specimen:  cervix & uterus  DISPOSITION OF SPECIMEN:  PATHOLOGY  COUNTS:  YES  TOURNIQUET:  * No tourniquets in log *  DICTATION: .Note written in EPIC  PLAN OF CARE: Admit for overnight observation  PATIENT DISPOSITION:  PACU - hemodynamically stable.   Delay start of Pharmacological VTE agent (>24hrs) due to surgical blood loss or risk of bleeding: yes

## 2014-05-25 ENCOUNTER — Encounter (HOSPITAL_COMMUNITY): Payer: Self-pay | Admitting: Obstetrics & Gynecology

## 2014-05-25 LAB — CBC
HCT: 23.4 % — ABNORMAL LOW (ref 36.0–46.0)
Hemoglobin: 7.1 g/dL — ABNORMAL LOW (ref 12.0–15.0)
MCH: 27.7 pg (ref 26.0–34.0)
MCHC: 30.3 g/dL (ref 30.0–36.0)
MCV: 91.4 fL (ref 78.0–100.0)
PLATELETS: 221 10*3/uL (ref 150–400)
RBC: 2.56 MIL/uL — ABNORMAL LOW (ref 3.87–5.11)
RDW: 22.4 % — AB (ref 11.5–15.5)
WBC: 8.5 10*3/uL (ref 4.0–10.5)

## 2014-05-25 LAB — BASIC METABOLIC PANEL
BUN: 8 mg/dL (ref 6–23)
CALCIUM: 8.1 mg/dL — AB (ref 8.4–10.5)
CO2: 25 mEq/L (ref 19–32)
CREATININE: 0.8 mg/dL (ref 0.50–1.10)
Chloride: 106 mEq/L (ref 96–112)
GFR calc Af Amer: >90 mL/min (ref >90)
GFR calc non Af Amer: 84 mL/min — ABNORMAL LOW (ref >90)
Glucose, Bld: 128 mg/dL — ABNORMAL HIGH (ref 70–99)
Potassium: 3.9 mEq/L (ref 3.7–5.3)
SODIUM: 139 meq/L (ref 137–147)

## 2014-05-25 MED ORDER — IBUPROFEN 800 MG PO TABS: 800.0000 mg | ORAL_TABLET | Freq: Three times a day (TID) | ORAL | Status: DC | PRN

## 2014-05-25 MED ORDER — OXYCODONE-ACETAMINOPHEN 5-325 MG PO TABS: 1.0000 | ORAL_TABLET | ORAL | Status: DC | PRN

## 2014-05-25 MED ORDER — DSS 100 MG PO CAPS: 100.0000 mg | ORAL_CAPSULE | Freq: Two times a day (BID) | ORAL | Status: DC

## 2014-05-25 MED ORDER — DEXTROSE 5 % IV SOLN
2.0000 g | Freq: Once | INTRAVENOUS | Status: AC
  Administered 2014-05-25: 2 g via INTRAVENOUS
  Filled 2014-05-25: qty 2

## 2014-05-25 NOTE — Discharge Instructions (Signed)

## 2014-05-25 NOTE — Progress Notes (Signed)
Pt discharged home with daughter... Discharge instructions reviewed with pt and she verbalized understanding... Condition stable... No equipment... Ambulated to car with Kathlynn Grate, NT.

## 2014-05-30 NOTE — Discharge Summary (Signed)
Physician Discharge Summary  Patient ID: Shawna Rodgers MRN: [id number] DOB/AGE: June 10, 1961 53 y.o.  Admit date: 05/24/2014 Discharge date: 05/30/2014  Admission Diagnoses: uterine fibroids  Discharge Diagnoses:  Active Problems:   Post-operative state   Discharged Condition: good  Hospital Course: Pt was admitted for overnight observation after Robot assisted total laparoscopic hysterectomy.  She did well post op.  She was able to eat a regular diet with no nausea or emesis. She reported passing flatus.  She reports pain well controlled with po pain meds. She reports being ready to go home.   Consults: None  Significant Diagnostic Studies: labs: CBC  Treatments: IV hydration, analgesia: Morphine and Percocet and surgery: Robot assisted total laparoscopic hysterectomy  Discharge Exam: Blood pressure 123/58, pulse 95, temperature 98.8 F (37.1 C), temperature source Oral, resp. rate 16, height 5\' 6"  (1.676 m), weight 200 lb (90.719 kg), SpO2 99.00%. General appearance: alert and no distress Resp: clear to auscultation bilaterally Chest wall: no tenderness, N/A GI: soft, non-tender; bowel sounds normal; no masses,  no organomegaly Extremities: extremities normal, atraumatic, no cyanosis or edema Incision/Wound:all post sites clean, dry and intact  Disposition: 01-Home or Self Care  Discharge Instructions    Remove dressing in 72 hours    Complete by:  As directed   Keep steri-strips on for 2 weeks or until they fall off     Call MD for:  difficulty breathing, headache or visual disturbances    Complete by:  As directed      Call MD for:  extreme fatigue    Complete by:  As directed      Call MD for:  hives    Complete by:  As directed      Call MD for:  persistant dizziness or light-headedness    Complete by:  As directed      Call MD for:  persistant nausea and vomiting    Complete by:  As directed      Call MD for:  redness, tenderness, or signs of infection (pain,  swelling, redness, odor or green/yellow discharge around incision site)    Complete by:  As directed      Call MD for:  severe uncontrolled pain    Complete by:  As directed      Call MD for:  temperature >100.4    Complete by:  As directed      Diet - low sodium heart healthy    Complete by:  As directed      Driving Restrictions    Complete by:  As directed   No driving for 2 weeks or while on po pain medications     Increase activity slowly    Complete by:  As directed      Lifting restrictions    Complete by:  As directed   No heavy lifting     Sexual Activity Restrictions    Complete by:  As directed   Nothing in vagina for 6 weeks.  NO intercourse for 6 (six) weeks!            Medication List    STOP taking these medications       megestrol 40 MG tablet  Commonly known as:  MEGACE      TAKE these medications       albuterol 108 (90 BASE) MCG/ACT inhaler  Commonly known as:  PROVENTIL HFA;VENTOLIN HFA  Inhale 2 puffs into the lungs every 6 (six) hours as needed for wheezing or  shortness of breath.     DSS 100 MG Caps  Take 100 mg by mouth 2 (two) times daily.     ferrous sulfate 325 (65 FE) MG EC tablet  Take 325 mg by mouth 2 (two) times daily with a meal.     ibuprofen 800 MG tablet  Commonly known as:  ADVIL,MOTRIN  Take 1 tablet (800 mg total) by mouth every 8 (eight) hours as needed (mild pain).     meloxicam 15 MG tablet  Commonly known as:  MOBIC  Take 1 tablet (15 mg total) by mouth 2 (two) times daily as needed for pain.     oxyCODONE-acetaminophen 5-325 MG per tablet  Commonly known as:  PERCOCET/ROXICET  Take 1-2 tablets by mouth every 4 (four) hours as needed for severe pain (moderate to severe pain (when tolerating fluids)).           Follow-up Information   Follow up with Lavonia Drafts, MD In 2 weeks.   Specialty:  Obstetrics and Gynecology   Contact information:   [place] [address] [phone]        Signed: Lavonia Drafts 05/30/2014, 12:13 PM

## 2014-06-07 ENCOUNTER — Encounter: Payer: Self-pay | Admitting: Obstetrics & Gynecology

## 2014-06-07 ENCOUNTER — Ambulatory Visit (INDEPENDENT_AMBULATORY_CARE_PROVIDER_SITE_OTHER): Payer: BC Managed Care – PPO | Admitting: Obstetrics & Gynecology

## 2014-06-07 VITALS — BP 119/73 | HR 88 | Temp 98.8°F | Ht 66.0 in | Wt 191.4 lb

## 2014-06-07 DIAGNOSIS — Z9889 Other specified postprocedural states: Secondary | ICD-10-CM

## 2014-06-07 NOTE — Progress Notes (Signed)
Subjective:     Patient ID: Shawna Rodgers, female   DOB: 1961-10-29, 53 y.o.   MRN: [id number]  HPI Pt presents for 2 weeks post op check.  She is currently without complaints. She does report a large amount of gas.  She is voiding and passing stools without difficulty. She is eating an ddrinking without problems.  She has started smoking tob again.  She reports adequate pain control.  Review of Systems     Objective:   Physical ExamPt in NAD BP 119/73  Pulse 88  Temp(Src) 98.8 F (37.1 C) (Oral)  Ht 5\' 6"  (1.676 m)  Wt 191 lb 6.4 oz (86.818 kg)  BMI 30.91 kg/m2  LMP 02/12/2014 Abd: soft, ND, NT.  Her port sites are well healed.  GU: not done     05/24/2014 Diagnosis Uterus and cervix, with fibroids - UTERUS: -ENDOMETRIUM: SECRETORY ENDOMETRIUM. NO HYPERPLASIA OR MALIGNANCY. -MYOMETRIUM: LEIOMYOMATA. NO MALIGNANCY IDENTIFIED. -SEROSA: FIBROUS ADHESIONS. NO MALIGNANCY. - CERVIX: BENIGN SQUAMOUS AND ENDOCERVICAL MUCOSA. NO DYSPLASIA OR MALIGNANCY Assessment:     2 week post op check s/p Robot assisted hyst     Plan:     F/u in 4 weeks Gradual increase of activities Noting per vagina for 6 weeks May return to work light duty if desired.

## 2014-06-07 NOTE — Patient Instructions (Signed)

## 2014-07-02 ENCOUNTER — Ambulatory Visit: Payer: BC Managed Care – PPO | Admitting: Obstetrics & Gynecology

## 2014-07-02 ENCOUNTER — Telehealth: Payer: Self-pay

## 2014-07-02 NOTE — Telephone Encounter (Signed)
Patient missed today's 6 week post op check. Patient stated she forgot all about it. Informed patient I will have it rescheduled and she will get a call from the front office staff in the next few days. Patient verbalized understanding.

## 2014-07-03 ENCOUNTER — Encounter: Payer: Self-pay | Admitting: Obstetrics & Gynecology

## 2014-08-10 ENCOUNTER — Ambulatory Visit: Payer: BC Managed Care – PPO | Admitting: Obstetrics & Gynecology

## 2014-10-08 ENCOUNTER — Encounter: Payer: Self-pay | Admitting: Obstetrics & Gynecology

## 2015-06-09 ENCOUNTER — Encounter (HOSPITAL_COMMUNITY): Payer: Self-pay | Admitting: Nurse Practitioner

## 2015-06-09 ENCOUNTER — Emergency Department (HOSPITAL_COMMUNITY)
Admission: EM | Admit: 2015-06-09 | Discharge: 2015-06-09 | Disposition: A | Discharge status: Home / Self Care | Payer: No Typology Code available for payment source | Attending: Emergency Medicine | Admitting: Emergency Medicine

## 2015-06-09 DIAGNOSIS — J45909 Unspecified asthma, uncomplicated: Secondary | ICD-10-CM | POA: Insufficient documentation

## 2015-06-09 DIAGNOSIS — D649 Anemia, unspecified: Secondary | ICD-10-CM | POA: Diagnosis not present

## 2015-06-09 DIAGNOSIS — Z79899 Other long term (current) drug therapy: Secondary | ICD-10-CM | POA: Diagnosis not present

## 2015-06-09 DIAGNOSIS — Z87891 Personal history of nicotine dependence: Secondary | ICD-10-CM | POA: Diagnosis not present

## 2015-06-09 DIAGNOSIS — R21 Rash and other nonspecific skin eruption: Secondary | ICD-10-CM | POA: Diagnosis not present

## 2015-06-09 MED ORDER — DEXAMETHASONE SODIUM PHOSPHATE 10 MG/ML IJ SOLN
10.0000 mg | Freq: Once | INTRAMUSCULAR | Status: AC
  Administered 2015-06-09: 10 mg via INTRAMUSCULAR
  Filled 2015-06-09: qty 1

## 2015-06-09 MED ORDER — HYDROXYZINE HCL 25 MG PO TABS
25.0000 mg | ORAL_TABLET | Freq: Four times a day (QID) | ORAL | Status: DC
Start: 2015-06-09 — End: 2015-07-19

## 2015-06-09 MED ORDER — PERMETHRIN 5 % EX CREA: TOPICAL_CREAM | CUTANEOUS | Status: DC

## 2015-06-09 NOTE — ED Provider Notes (Signed)
CSN: [id number]     Arrival date & time 06/09/15  1358 History  This chart was scribed for non-physician practitioner, Jeannett Senior, PA-C working with Daleen Bo, MD by [name], ED Scribe. This patient was seen in room TR04C/TR04C and the patient's care was started at 3:25 PM.    Chief Complaint  Patient presents with  . Rash    The history is provided by the patient. No language interpreter was used.     HPI Comments:  Shawna Rodgers is a 54 y.o. female who presents to the Emergency Department complaining of a pruritic rash to her BUE, L>R, torso and face for 2 days. She notes the rash started on her left forearm and has since spread. Pt describes stingy pain to the site of the rash. She denies recent garden work. She has applied OTC cream to the area without improvement. Pt reports a h/o scabies in the 1980s.   Past Medical History  Diagnosis Date  . Asthma   . Chronic bronchitis   . History of blood transfusion 08/07/2013    "first one I've ever had was today" (08/07/2013)  . Anemia    Past Surgical History  Procedure Laterality Date  . No past surgeries    . Robotic assisted total hysterectomy N/A 05/24/2014    Procedure: ATTEMPTED ROBOTIC ASSISTED TOTAL HYSTERECTOMY ;  Surgeon: Lavonia Drafts, MD;  Location: [facility];  Service: Gynecology;  Laterality: N/A;  wound class contaminated  . Vaginal hysterectomy N/A 05/24/2014    Procedure: HYSTERECTOMY VAGINAL,;  Surgeon: Lavonia Drafts, MD;  Location: [facility];  Service: Gynecology;  Laterality: N/A;  converted to vaginal procedure at 0926  . Cystoscopy N/A 05/24/2014    Procedure: CYSTOSCOPY;  Surgeon: Lavonia Drafts, MD;  Location: [facility];  Service: Gynecology;  Laterality: N/A;   Family History  Problem Relation Age of Onset  . Breast cancer Mother   . Diabetes Mellitus II Father    History  Substance Use Topics  . Smoking status: Former Smoker -- 0.50 packs/day for 32 years    Types:  Cigarettes    Quit date: 02/14/2014  . Smokeless tobacco: Former Systems developer    Quit date: 02/14/2014  . Alcohol Use: Yes     Comment: 08/07/2013 "holidays, birthdays I'll have a mixed drink or glass of wine"   OB History    Gravida Para Term Preterm AB TAB SAB Ectopic Multiple Living   4 3 3  1 1  0 0 0 3     Review of Systems  Constitutional: Negative for fever and chills.  Skin: Positive for rash.      Allergies  Review of patient's allergies indicates no known allergies.  Home Medications   Prior to Admission medications   Medication Sig Start Date End Date Taking? Authorizing Provider  albuterol (PROVENTIL HFA;VENTOLIN HFA) 108 (90 BASE) MCG/ACT inhaler Inhale 2 puffs into the lungs every 6 (six) hours as needed for wheezing or shortness of breath.    Historical Provider, MD  docusate sodium 100 MG CAPS Take 100 mg by mouth 2 (two) times daily. 05/25/14   Lavonia Drafts, MD  ferrous sulfate 325 (65 FE) MG EC tablet Take 325 mg by mouth 2 (two) times daily with a meal. 08/09/13   Charlynne Cousins, MD  ibuprofen (ADVIL,MOTRIN) 800 MG tablet Take 1 tablet (800 mg total) by mouth every 8 (eight) hours as needed (mild pain). 05/25/14   Lavonia Drafts, MD   BP 163/102 mmHg  Pulse  98  Temp(Src) 98.5 F (36.9 C) (Oral)  Resp 20  Ht 5\' 4"  (1.626 m)  Wt 189 lb (85.73 kg)  BMI 32.43 kg/m2  LMP 02/12/2014 Physical Exam  Constitutional: She appears well-developed and well-nourished. No distress.  HENT:  Head: Normocephalic and atraumatic.  No rash over lips or oral mucosa  Eyes: Conjunctivae are normal.  Neck: Normal range of motion. Neck supple.  Cardiovascular: Normal rate.   Pulmonary/Chest: Effort normal.  Musculoskeletal: Normal range of motion.  Neurological: She is alert.  Skin: Skin is warm and dry.  Rash mainly to bilateral wrist, left forearm, left upper arm, bilateral flank, groin, neck and chil. Rash is pearly papular with several pustules, erythematous.  Very pruritic.   Nursing note and vitals reviewed.   ED Course  Procedures   DIAGNOSTIC STUDIES:    COORDINATION OF CARE:  3:28 PM Discussed treatment plan with pt at bedside and pt agreed to plan.  Labs Review Labs Reviewed - No data to display  Imaging Review No results found.   EKG Interpretation None      MDM   Final diagnoses:  Rash    patient with a rash, very itchy, most consistent with possible scabies. Will give Decadron for itching IM, permethrin prescription, Vistaril for itching at home. Follow-up his primary care doctor.  Filed Vitals:   06/09/15 1406  BP: 163/102  Pulse: 98  Temp: 98.5 F (36.9 C)  TempSrc: Oral  Resp: 20  Height: 5\' 4"  (1.626 m)  Weight: 189 lb (85.73 kg)    I personally performed the services described in this documentation, which was scribed in my presence. The recorded information has been reviewed and is accurate.   Jeannett Senior, PA-C 06/09/15 [place], MD 06/10/15 [phone]

## 2015-06-09 NOTE — ED Notes (Signed)
Pt c/o itchy, irritated red bumps spreading from arms to neck and trunk onset yesterday. She applied OTC topical creams with no relief. She denies using new medications or products recently

## 2015-06-09 NOTE — Discharge Instructions (Signed)
Take Vistaril for itching, you can also use over-the-counter hydrocortisone cream over the rash areas. Apply permethrin cream and use it again in 1 week. Follow with her doctor return if not improving or worsening.  Rash A rash is a change in the color or texture of your skin. There are many different types of rashes. You may have other problems that accompany your rash. CAUSES   Infections.  Allergic reactions. This can include allergies to pets or foods.  Certain medicines.  Exposure to certain chemicals, soaps, or cosmetics.  Heat.  Exposure to poisonous plants.  Tumors, both cancerous and noncancerous. SYMPTOMS   Redness.  Scaly skin.  Itchy skin.  Dry or cracked skin.  Bumps.  Blisters.  Pain. DIAGNOSIS  Your caregiver may do a physical exam to determine what type of rash you have. A skin sample (biopsy) may be taken and examined under a microscope. TREATMENT  Treatment depends on the type of rash you have. Your caregiver may prescribe certain medicines. For serious conditions, you may need to see a skin doctor (dermatologist). HOME CARE INSTRUCTIONS   Avoid the substance that caused your rash.  Do not scratch your rash. This can cause infection.  You may take cool baths to help stop itching.  Only take over-the-counter or prescription medicines as directed by your caregiver.  Keep all follow-up appointments as directed by your caregiver. SEEK IMMEDIATE MEDICAL CARE IF:  You have increasing pain, swelling, or redness.  You have a fever.  You have new or severe symptoms.  You have body aches, diarrhea, or vomiting.  Your rash is not better after 3 days. MAKE SURE YOU:  Understand these instructions.  Will watch your condition.  Will get help right away if you are not doing well or get worse. Document Released: 11/13/2002 Document Revised: 02/15/2012 Document Reviewed: 09/07/2011 [facility] Patient Information 2015 [place], [place]. This information  is not intended to replace advice given to you by your health care provider. Make sure you discuss any questions you have with your health care provider.  Scabies Scabies are small bugs (mites) that burrow under the skin and cause red bumps and severe itching. These bugs can only be seen with a microscope. Scabies are highly contagious. They can spread easily from person to person by direct contact. They are also spread through sharing clothing or linens that have the scabies mites living in them. It is not unusual for an entire family to become infected through shared towels, clothing, or bedding.  HOME CARE INSTRUCTIONS   Your caregiver may prescribe a cream or lotion to kill the mites. If cream is prescribed, massage the cream into the entire body from the neck to the bottom of both feet. Also massage the cream into the scalp and face if your child is less than 59 year old. Avoid the eyes and mouth. Do not wash your hands after application.  Leave the cream on for 8 to 12 hours. Your child should bathe or shower after the 8 to 12 hour application period. Sometimes it is helpful to apply the cream to your child right before bedtime.  One treatment is usually effective and will eliminate approximately 95% of infestations. For severe cases, your caregiver may decide to repeat the treatment in 1 week. Everyone in your household should be treated with one application of the cream.  New rashes or burrows should not appear within 24 to 48 hours after successful treatment. However, the itching and rash may last for 2  to 4 weeks after successful treatment. Your caregiver may prescribe a medicine to help with the itching or to help the rash go away more quickly.  Scabies can live on clothing or linens for up to 3 days. All of your child's recently used clothing, towels, stuffed toys, and bed linens should be washed in hot water and then dried in a dryer for at least 20 minutes on high heat. Items that cannot  be washed should be enclosed in a plastic bag for at least 3 days.  To help relieve itching, bathe your child in a cool bath or apply cool washcloths to the affected areas.  Your child may return to school after treatment with the prescribed cream. SEEK MEDICAL CARE IF:   The itching persists longer than 4 weeks after treatment.  The rash spreads or becomes infected. Signs of infection include red blisters or yellow-tan crust. Document Released: 11/23/2005 Document Revised: 02/15/2012 Document Reviewed: 04/03/2009 [facility] Patient Information 2015 [place], [place]. This information is not intended to replace advice given to you by your health care provider. Make sure you discuss any questions you have with your health care provider.

## 2015-06-09 NOTE — ED Notes (Signed)
Declined W/C at D/C and was escorted to lobby by RN. 

## 2015-07-19 ENCOUNTER — Encounter (HOSPITAL_COMMUNITY): Payer: Self-pay

## 2015-07-19 ENCOUNTER — Emergency Department (HOSPITAL_COMMUNITY)
Admission: EM | Admit: 2015-07-19 | Discharge: 2015-07-19 | Disposition: A | Discharge status: Home / Self Care | Payer: No Typology Code available for payment source | Attending: Emergency Medicine | Admitting: Emergency Medicine

## 2015-07-19 DIAGNOSIS — Z87891 Personal history of nicotine dependence: Secondary | ICD-10-CM | POA: Insufficient documentation

## 2015-07-19 DIAGNOSIS — Z862 Personal history of diseases of the blood and blood-forming organs and certain disorders involving the immune mechanism: Secondary | ICD-10-CM | POA: Diagnosis not present

## 2015-07-19 DIAGNOSIS — J45909 Unspecified asthma, uncomplicated: Secondary | ICD-10-CM | POA: Diagnosis not present

## 2015-07-19 DIAGNOSIS — R21 Rash and other nonspecific skin eruption: Secondary | ICD-10-CM | POA: Insufficient documentation

## 2015-07-19 MED ORDER — FAMOTIDINE 20 MG PO TABS
20.0000 mg | ORAL_TABLET | Freq: Once | ORAL | Status: AC
  Administered 2015-07-19: 20 mg via ORAL
  Filled 2015-07-19: qty 1

## 2015-07-19 MED ORDER — CEPHALEXIN 250 MG PO CAPS
500.0000 mg | ORAL_CAPSULE | Freq: Once | ORAL | Status: AC
  Administered 2015-07-19: 500 mg via ORAL
  Filled 2015-07-19: qty 2

## 2015-07-19 MED ORDER — METHYLPREDNISOLONE SODIUM SUCC 125 MG IJ SOLR
125.0000 mg | Freq: Once | INTRAMUSCULAR | Status: AC
  Administered 2015-07-19: 125 mg via INTRAMUSCULAR
  Filled 2015-07-19: qty 2

## 2015-07-19 MED ORDER — DIPHENHYDRAMINE HCL 25 MG PO CAPS
50.0000 mg | ORAL_CAPSULE | Freq: Once | ORAL | Status: AC
  Administered 2015-07-19: 50 mg via ORAL
  Filled 2015-07-19: qty 2

## 2015-07-19 MED ORDER — PREDNISONE 50 MG PO TABS: ORAL_TABLET | ORAL | Status: DC

## 2015-07-19 MED ORDER — CEPHALEXIN 500 MG PO CAPS: 500.0000 mg | ORAL_CAPSULE | Freq: Four times a day (QID) | ORAL | Status: DC

## 2015-07-19 MED ORDER — FAMOTIDINE 20 MG PO TABS: 20.0000 mg | ORAL_TABLET | Freq: Two times a day (BID) | ORAL | Status: DC

## 2015-07-19 NOTE — Discharge Instructions (Signed)
1 to 2 tablets of 25 mg Benadryl pills every 4-6 hours as needed to a maximum of 300 mg per day. In addition, you may apply a topical hydrocortisone ointment to all affected areas except for the face.   Do not hesitate to call 911 or return to the emergency room if you develop any shortness of breath, wheezing, tongue or lip swelling.  Please follow with your primary care doctor in the next 2 days for a check-up. They must obtain records for further management.   Do not hesitate to return to the Emergency Department for any new, worsening or concerning symptoms.   Contact Dermatitis Contact dermatitis is a reaction to certain substances that touch the skin. Contact dermatitis can be either irritant contact dermatitis or allergic contact dermatitis. Irritant contact dermatitis does not require previous exposure to the substance for a reaction to occur.Allergic contact dermatitis only occurs if you have been exposed to the substance before. Upon a repeat exposure, your body reacts to the substance.  CAUSES  Many substances can cause contact dermatitis. Irritant dermatitis is most commonly caused by repeated exposure to mildly irritating substances, such as:  Makeup.  Soaps.  Detergents.  Bleaches.  Acids.  Metal salts, such as nickel. Allergic contact dermatitis is most commonly caused by exposure to:  Poisonous plants.  Chemicals (deodorants, shampoos).  Jewelry.  Latex.  Neomycin in triple antibiotic cream.  Preservatives in products, including clothing. SYMPTOMS  The area of skin that is exposed may develop:  Dryness or flaking.  Redness.  Cracks.  Itching.  Pain or a burning sensation.  Blisters. With allergic contact dermatitis, there may also be swelling in areas such as the eyelids, mouth, or genitals.  DIAGNOSIS  Your caregiver can usually tell what the problem is by doing a physical exam. In cases where the cause is uncertain and an allergic contact  dermatitis is suspected, a patch skin test may be performed to help determine the cause of your dermatitis. TREATMENT Treatment includes protecting the skin from further contact with the irritating substance by avoiding that substance if possible. Barrier creams, powders, and gloves may be helpful. Your caregiver may also recommend:  Steroid creams or ointments applied 2 times daily. For best results, soak the rash area in cool water for 20 minutes. Then apply the medicine. Cover the area with a plastic wrap. You can store the steroid cream in the refrigerator for a "chilly" effect on your rash. That may decrease itching. Oral steroid medicines may be needed in more severe cases.  Antibiotics or antibacterial ointments if a skin infection is present.  Antihistamine lotion or an antihistamine taken by mouth to ease itching.  Lubricants to keep moisture in your skin.  Burow's solution to reduce redness and soreness or to dry a weeping rash. Mix one packet or tablet of solution in 2 cups cool water. Dip a clean washcloth in the mixture, wring it out a bit, and put it on the affected area. Leave the cloth in place for 30 minutes. Do this as often as possible throughout the day.  Taking several cornstarch or baking soda baths daily if the area is too large to cover with a washcloth. Harsh chemicals, such as alkalis or acids, can cause skin damage that is like a burn. You should flush your skin for 15 to 20 minutes with cold water after such an exposure. You should also seek immediate medical care after exposure. Bandages (dressings), antibiotics, and pain medicine may be needed for  severely irritated skin.  HOME CARE INSTRUCTIONS  Avoid the substance that caused your reaction.  Keep the area of skin that is affected away from hot water, soap, sunlight, chemicals, acidic substances, or anything else that would irritate your skin.  Do not scratch the rash. Scratching may cause the rash to become  infected.  You may take cool baths to help stop the itching.  Only take over-the-counter or prescription medicines as directed by your caregiver.  See your caregiver for follow-up care as directed to make sure your skin is healing properly. SEEK MEDICAL CARE IF:   Your condition is not better after 3 days of treatment.  You seem to be getting worse.  You see signs of infection such as swelling, tenderness, redness, soreness, or warmth in the affected area.  You have any problems related to your medicines. Document Released: 11/20/2000 Document Revised: 02/15/2012 Document Reviewed: 04/28/2011 [facility] Patient Information 2015 [place], [place]. This information is not intended to replace advice given to you by your health care provider. Make sure you discuss any questions you have with your health care provider.

## 2015-07-19 NOTE — ED Provider Notes (Signed)
CSN: [id number]     Arrival date & time 07/19/15  1109 History  This chart was scribed for non-physician practitioner Monico Blitz, PA-C working with Forde Dandy, MD by Zola Button, ED Scribe. This patient was seen in room TR10C/TR10C and the patient's care was started at 12:30 PM.       No chief complaint on file.  The history is provided by the patient. No language interpreter was used.   HPI Comments: Shawna Rodgers is a 54 y.o. female who presents to the Emergency Department complaining of a painful, pruritic rash to her posterior neck after she was bitten by what she believes was a wasp 6 days ago. She decided to come in today because she began noticing some drainage from the rash. Patient denies fever and chills. She has also been applying tobacco in toothpaste to the area with little relief.   Past Medical History  Diagnosis Date  . Asthma   . Chronic bronchitis   . History of blood transfusion 08/07/2013    "first one I've ever had was today" (08/07/2013)  . Anemia    Past Surgical History  Procedure Laterality Date  . No past surgeries    . Robotic assisted total hysterectomy N/A 05/24/2014    Procedure: ATTEMPTED ROBOTIC ASSISTED TOTAL HYSTERECTOMY ;  Surgeon: Lavonia Drafts, MD;  Location: [facility];  Service: Gynecology;  Laterality: N/A;  wound class contaminated  . Vaginal hysterectomy N/A 05/24/2014    Procedure: HYSTERECTOMY VAGINAL,;  Surgeon: Lavonia Drafts, MD;  Location: [facility];  Service: Gynecology;  Laterality: N/A;  converted to vaginal procedure at 0926  . Cystoscopy N/A 05/24/2014    Procedure: CYSTOSCOPY;  Surgeon: Lavonia Drafts, MD;  Location: [facility];  Service: Gynecology;  Laterality: N/A;   Family History  Problem Relation Age of Onset  . Breast cancer Mother   . Diabetes Mellitus II Father    Social History  Substance Use Topics  . Smoking status: Former Smoker -- 0.50 packs/day for 32 years    Types: Cigarettes    Quit date:  02/14/2014  . Smokeless tobacco: Former Systems developer    Quit date: 02/14/2014  . Alcohol Use: Yes     Comment: 08/07/2013 "holidays, birthdays I'll have a mixed drink or glass of wine"   OB History    Gravida Para Term Preterm AB TAB SAB Ectopic Multiple Living   4 3 3  1 1  0 0 0 3     Review of Systems A complete 10 system review of systems was obtained and all systems are negative except as noted in the HPI and PMH.     Allergies  Review of patient's allergies indicates no known allergies.  Home Medications   Prior to Admission medications   Medication Sig Start Date End Date Taking? Authorizing Provider  albuterol (PROVENTIL HFA;VENTOLIN HFA) 108 (90 BASE) MCG/ACT inhaler Inhale 2 puffs into the lungs every 6 (six) hours as needed for wheezing or shortness of breath.    Historical Provider, MD  ibuprofen (ADVIL,MOTRIN) 800 MG tablet Take 1 tablet (800 mg total) by mouth every 8 (eight) hours as needed (mild pain). 05/25/14   Lavonia Drafts, MD   BP 142/87 mmHg  Pulse 83  Temp(Src) 98.2 F (36.8 C) (Oral)  Resp 16  SpO2 99%  LMP 02/12/2014 Physical Exam  Constitutional: She is oriented to person, place, and time. She appears well-developed and well-nourished. No distress.  HENT:  Head: Normocephalic and atraumatic.  Mouth/Throat: Oropharynx is  clear and moist. No oropharyngeal exudate.  Eyes: Pupils are equal, round, and reactive to light.  Neck: Neck supple.  Cardiovascular: Normal rate.   Pulmonary/Chest: Effort normal.  Musculoskeletal: She exhibits no edema.  Neurological: She is alert and oriented to person, place, and time. No cranial nerve deficit.  Skin: Skin is warm and dry. Rash noted.  Patient has maculopapular rash to bilateral lower neck with no warmth, surrounding induration, tenderness to palpation she does have a scant amount of crusting discharge to the area  Psychiatric: She has a normal mood and affect. Her behavior is normal.  Nursing note and vitals  reviewed.   ED Course  Procedures  DIAGNOSTIC STUDIES: Oxygen Saturation is 99% on room air, normal by my interpretation.    COORDINATION OF CARE: 12:33 PM-Discussed treatment plan which includes medications with patient/guardian at bedside and patient/guardian agreed to plan.    Labs Review Labs Reviewed - No data to display  Imaging Review No results found. I personally reviewed and evaluated these images and lab results as part of my medical decision-making.   EKG Interpretation None      MDM   Final diagnoses:  None    Filed Vitals:   07/19/15 1115 07/19/15 1256  BP: 142/87 146/81  Pulse: 83 81  Temp: 98.2 F (36.8 C) 98.4 F (36.9 C)  TempSrc: Oral Oral  Resp: 16 16  SpO2: 99% 100%    Medications  methylPREDNISolone sodium succinate (SOLU-MEDROL) 125 mg/2 mL injection 125 mg (125 mg Intramuscular Given 07/19/15 1244)  diphenhydrAMINE (BENADRYL) capsule 50 mg (50 mg Oral Given 07/19/15 1241)  famotidine (PEPCID) tablet 20 mg (20 mg Oral Given 07/19/15 1241)  cephALEXin (KEFLEX) capsule 500 mg (500 mg Oral Given 07/19/15 1241)    [name] is a pleasant 54 y.o. female presenting with rash to posterior neck which she sustained after she was bitten by a wasp several days ago. Patient has been applying toothpaste and tobacco to the area, I think this is a atypical contact dermatitis from her home remedies. I've advised her to DC these and will treat her for an allergic reaction with prednisone, Benadryl and Pepcid. Patient is reporting a discharge from the area. I will start her on Keflex prophylactically.   Evaluation does not show pathology that would require ongoing emergent intervention or inpatient treatment. Pt is hemodynamically stable and mentating appropriately. Discussed findings and plan with patient/guardian, who agrees with care plan. All questions answered. Return precautions discussed and outpatient follow up given.   Discharge Medication List as of  07/19/2015 12:37 PM    START taking these medications   Details  cephALEXin (KEFLEX) 500 MG capsule Take 1 capsule (500 mg total) by mouth 4 (four) times daily., Starting 07/19/2015, Until Discontinued, Print    famotidine (PEPCID) 20 MG tablet Take 1 tablet (20 mg total) by mouth 2 (two) times daily., Starting 07/19/2015, Until Discontinued, Print    predniSONE (DELTASONE) 50 MG tablet Take 1 tablet daily with breakfast, Print           Monico Blitz, PA-C 07/19/15 [place] Liu, MD 07/19/15 2122

## 2015-07-19 NOTE — ED Notes (Signed)
Pt presents with red, painful rash x 5 days.  Pt reports getting stung by wasp on Saturday to back of neck that she reports is still painful.  Pt reports blisters that "leak".

## 2015-08-01 ENCOUNTER — Emergency Department (HOSPITAL_COMMUNITY)
Admission: EM | Admit: 2015-08-01 | Discharge: 2015-08-01 | Disposition: A | Discharge status: Home / Self Care | Payer: No Typology Code available for payment source | Attending: Emergency Medicine | Admitting: Emergency Medicine

## 2015-08-01 ENCOUNTER — Encounter (HOSPITAL_COMMUNITY): Payer: Self-pay | Admitting: Emergency Medicine

## 2015-08-01 DIAGNOSIS — Z87891 Personal history of nicotine dependence: Secondary | ICD-10-CM | POA: Diagnosis not present

## 2015-08-01 DIAGNOSIS — Z79899 Other long term (current) drug therapy: Secondary | ICD-10-CM | POA: Insufficient documentation

## 2015-08-01 DIAGNOSIS — R21 Rash and other nonspecific skin eruption: Secondary | ICD-10-CM | POA: Insufficient documentation

## 2015-08-01 DIAGNOSIS — Z792 Long term (current) use of antibiotics: Secondary | ICD-10-CM | POA: Diagnosis not present

## 2015-08-01 DIAGNOSIS — J45909 Unspecified asthma, uncomplicated: Secondary | ICD-10-CM | POA: Diagnosis not present

## 2015-08-01 DIAGNOSIS — Z862 Personal history of diseases of the blood and blood-forming organs and certain disorders involving the immune mechanism: Secondary | ICD-10-CM | POA: Insufficient documentation

## 2015-08-01 MED ORDER — DIPHENHYDRAMINE HCL 25 MG PO CAPS
25.0000 mg | ORAL_CAPSULE | Freq: Once | ORAL | Status: AC
  Administered 2015-08-01: 25 mg via ORAL
  Filled 2015-08-01: qty 1

## 2015-08-01 MED ORDER — HYDROXYZINE HCL 25 MG PO TABS: 25.0000 mg | ORAL_TABLET | Freq: Four times a day (QID) | ORAL | Status: DC

## 2015-08-01 MED ORDER — PREDNISONE 10 MG (21) PO TBPK
10.0000 mg | ORAL_TABLET | Freq: Every day | ORAL | Status: DC
Start: 2015-08-01 — End: 2015-08-12

## 2015-08-01 MED ORDER — FAMOTIDINE 20 MG PO TABS
20.0000 mg | ORAL_TABLET | Freq: Once | ORAL | Status: AC
  Administered 2015-08-01: 20 mg via ORAL
  Filled 2015-08-01: qty 1

## 2015-08-01 MED ORDER — LORATADINE 10 MG PO TABS: 10.0000 mg | ORAL_TABLET | Freq: Every day | ORAL | Status: DC

## 2015-08-01 MED ORDER — TRIAMCINOLONE ACETONIDE 0.025 % EX OINT: 1.0000 "application " | TOPICAL_OINTMENT | Freq: Two times a day (BID) | CUTANEOUS | Status: DC

## 2015-08-01 MED ORDER — PREDNISONE 20 MG PO TABS
60.0000 mg | ORAL_TABLET | Freq: Once | ORAL | Status: AC
  Administered 2015-08-01: 60 mg via ORAL
  Filled 2015-08-01: qty 3

## 2015-08-01 NOTE — ED Provider Notes (Signed)
CSN: [id number]     Arrival date & time 08/01/15  1022 History  This chart was scribed for non-physician practitioner Delsa Grana, PA-C working with Gareth Morgan, MD by Zola Button, ED Scribe. This patient was seen in room TR05C/TR05C and the patient's care was started at 11:29 AM.       Chief Complaint  Patient presents with  . Rash   The history is provided by the patient. No language interpreter was used.   HPI Comments: Shawna Rodgers is a 54 y.o. female with a history of eczema, asthma, and chronic bronchitis who presents to the Emergency Department complaining of gradual onset, pruritic rash to her neck and upper back that started about 3 weeks ago. Patient was seen here 13 days ago for the rash, which started after a wasp sting/bite 6 days prior. She was started on Solu-Medrol, Benadryl, Pepcid and Keflex at that time and noticed improvement for a short time, but it started to worsen again recently, especially yesterday and last night. She did try a topical medication yesterday which did provide relief to the itching. She has not tried any other treatments and has tried to keep her skin dry.  She reports having some wheezing intermittently. She denies facial swelling, numbness, tingling, difficulty swallowing, SOB, nausea, vomiting, and voice change. She does note having a history of sensitive skin.    Past Medical History  Diagnosis Date  . Asthma   . Chronic bronchitis   . History of blood transfusion 08/07/2013    "first one I've ever had was today" (08/07/2013)  . Anemia    Past Surgical History  Procedure Laterality Date  . No past surgeries    . Robotic assisted total hysterectomy N/A 05/24/2014    Procedure: ATTEMPTED ROBOTIC ASSISTED TOTAL HYSTERECTOMY ;  Surgeon: Lavonia Drafts, MD;  Location: [facility];  Service: Gynecology;  Laterality: N/A;  wound class contaminated  . Vaginal hysterectomy N/A 05/24/2014    Procedure: HYSTERECTOMY VAGINAL,;  Surgeon: Lavonia Drafts, MD;  Location: [facility];  Service: Gynecology;  Laterality: N/A;  converted to vaginal procedure at 0926  . Cystoscopy N/A 05/24/2014    Procedure: CYSTOSCOPY;  Surgeon: Lavonia Drafts, MD;  Location: [facility];  Service: Gynecology;  Laterality: N/A;   Family History  Problem Relation Age of Onset  . Breast cancer Mother   . Diabetes Mellitus II Father    Social History  Substance Use Topics  . Smoking status: Former Smoker -- 0.50 packs/day for 32 years    Types: Cigarettes    Quit date: 02/14/2014  . Smokeless tobacco: Former Systems developer    Quit date: 02/14/2014  . Alcohol Use: Yes     Comment: 08/07/2013 "holidays, birthdays I'll have a mixed drink or glass of wine"   OB History    Gravida Para Term Preterm AB TAB SAB Ectopic Multiple Living   4 3 3  1 1  0 0 0 3     Review of Systems  Constitutional: Negative.  Negative for fever, chills and diaphoresis.  HENT: Negative.   Respiratory: Negative for cough, choking, chest tightness, shortness of breath and stridor.   Cardiovascular: Negative for chest pain.  Gastrointestinal: Negative.   Skin: Positive for rash. Negative for pallor.  Neurological: Negative for light-headedness.  Psychiatric/Behavioral: Negative.       Allergies  Review of patient's allergies indicates no known allergies.  Home Medications   Prior to Admission medications   Medication Sig Start Date End Date Taking? Authorizing  Provider  albuterol (PROVENTIL HFA;VENTOLIN HFA) 108 (90 BASE) MCG/ACT inhaler Inhale 2 puffs into the lungs every 6 (six) hours as needed for wheezing or shortness of breath.    Historical Provider, MD  cephALEXin (KEFLEX) 500 MG capsule Take 1 capsule (500 mg total) by mouth 4 (four) times daily. 07/19/15   Nicole Pisciotta, PA-C  famotidine (PEPCID) 20 MG tablet Take 1 tablet (20 mg total) by mouth 2 (two) times daily. 07/19/15   Nicole Pisciotta, PA-C  hydrOXYzine (ATARAX/VISTARIL) 25 MG tablet Take 1 tablet (25 mg  total) by mouth every 6 (six) hours. 08/01/15   Delsa Grana, PA-C  ibuprofen (ADVIL,MOTRIN) 800 MG tablet Take 1 tablet (800 mg total) by mouth every 8 (eight) hours as needed (mild pain). 05/25/14   Lavonia Drafts, MD  loratadine (CLARITIN) 10 MG tablet Take 1 tablet (10 mg total) by mouth daily. 08/01/15   Delsa Grana, PA-C  predniSONE (STERAPRED UNI-PAK 21 TAB) 10 MG (21) TBPK tablet Take 1 tablet (10 mg total) by mouth daily. Take 6 tabs by mouth daily  for 2 days, then 5 tabs for 2 days, then 4 tabs for 2 days, then 3 tabs for 2 days, 2 tabs for 2 days, then 1 tab by mouth daily for 2 days 08/01/15   Delsa Grana, PA-C  triamcinolone (KENALOG) 0.025 % ointment Apply 1 application topically 2 (two) times daily. 08/01/15   Delsa Grana, PA-C   BP 129/88 mmHg  Pulse 71  Temp(Src) 97.7 F (36.5 C) (Oral)  Resp 16  SpO2 100%  LMP 02/12/2014 Physical Exam  Constitutional: She is oriented to person, place, and time. She appears well-developed and well-nourished. No distress.  HENT:  Head: Normocephalic and atraumatic.  Right Ear: External ear normal.  Left Ear: External ear normal.  Nose: Nose normal.  Mouth/Throat: Uvula is midline, oropharynx is clear and moist and mucous membranes are normal. Mucous membranes are not pale, not dry and not cyanotic. No uvula swelling. No oropharyngeal exudate, posterior oropharyngeal edema or posterior oropharyngeal erythema.  Eyes: Conjunctivae and EOM are normal. Pupils are equal, round, and reactive to light. Right eye exhibits no discharge. Left eye exhibits no discharge. No scleral icterus.  Neck: Normal range of motion. Neck supple. No JVD present. No tracheal deviation present.  Cardiovascular: Normal rate, regular rhythm, normal heart sounds and intact distal pulses.  Exam reveals no gallop and no friction rub.   No murmur heard. Pulses:      Radial pulses are 2+ on the right side, and 2+ on the left side.  Pulmonary/Chest: Effort normal and  breath sounds normal. No accessory muscle usage or stridor. No tachypnea. No respiratory distress. She has no decreased breath sounds. She has no wheezes. She has no rhonchi. She has no rales. She exhibits no tenderness and no retraction.  Abdominal: Soft. Bowel sounds are normal. She exhibits no distension. There is no tenderness. There is no rebound and no guarding.  Musculoskeletal: Normal range of motion. She exhibits no edema.  Lymphadenopathy:    She has no cervical adenopathy.  Neurological: She is alert and oriented to person, place, and time. She exhibits normal muscle tone. Coordination normal.  Skin: Skin is warm and dry. Rash noted. She is not diaphoretic. No erythema. No pallor.  Posterior neck - thick, dry, bumpy skin - plaque-like, excoriated, withou erythema or exudate.  Scattered, faint erythematous patches and macularpapular rash over upper chest No rash or swelling to face  Psychiatric: She has a normal  mood and affect. Her behavior is normal. Judgment and thought content normal.    ED Course  Procedures  DIAGNOSTIC STUDIES: Oxygen Saturation is 100% on room air, normal by my interpretation.    COORDINATION OF CARE: 11:35 AM-Discussed treatment plan which includes medications with patient/guardian at bedside and patient/guardian agreed to plan.    Labs Review Labs Reviewed - No data to display  Imaging Review No results found. I have personally reviewed and evaluated these images and lab results as part of my medical decision-making.   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  Pt has hx of atopy - encouraged hydration of skin - provided a list of hypoallergenic creams and ointments, given topical steroids.  Return precautions given,  encouraged f/up with PCP.   Pt is stable to d/c home, she has not had any other concerning signs or sx of an allergic reaction, good breath sounds, no swelling or rash near mouth.  Medications  famotidine (PEPCID) tablet 20 mg (20 mg Oral Given 08/01/15 1105)  diphenhydrAMINE (BENADRYL) capsule 25 mg (25 mg Oral Given 08/01/15 1105)  predniSONE (DELTASONE) tablet 60 mg (60 mg Oral Given 08/01/15 1105)   Filed Vitals:   08/01/15 1039 08/01/15 1158 08/01/15 1206  BP: 147/92 129/88   Pulse: 76  71  Temp: 98.4 F (36.9 C)  97.7 F (36.5 C)  TempSrc: Oral  Oral  Resp: 16  16  SpO2: 100%  100%    I personally performed the services described in this documentation, which was scribed in my presence. The recorded information has been reviewed and is accurate.    Delsa Grana, PA-C 08/05/15 6837  Gareth Morgan, MD 08/05/15 1600

## 2015-08-01 NOTE — ED Notes (Signed)
Declined W/C at D/C and was escorted to lobby by RN. 

## 2015-08-01 NOTE — Discharge Instructions (Signed)
Rash A rash is a change in the color or texture of your skin. There are many different types of rashes. You may have other problems that accompany your rash. CAUSES   Infections.  Allergic reactions. This can include allergies to pets or foods.  Certain medicines.  Exposure to certain chemicals, soaps, or cosmetics.  Heat.  Exposure to poisonous plants.  Tumors, both cancerous and noncancerous. SYMPTOMS   Redness.  Scaly skin.  Itchy skin.  Dry or cracked skin.  Bumps.  Blisters.  Pain. DIAGNOSIS  Your caregiver may do a physical exam to determine what type of rash you have. A skin sample (biopsy) may be taken and examined under a microscope. TREATMENT  Treatment depends on the type of rash you have. Your caregiver may prescribe certain medicines. For serious conditions, you may need to see a skin doctor (dermatologist). HOME CARE INSTRUCTIONS   Avoid the substance that caused your rash.  Do not scratch your rash. This can cause infection.  You may take cool baths to help stop itching.  Only take over-the-counter or prescription medicines as directed by your caregiver.  Keep all follow-up appointments as directed by your caregiver. SEEK IMMEDIATE MEDICAL CARE IF:  You have increasing pain, swelling, or redness.  You have a fever.  You have new or severe symptoms.  You have body aches, diarrhea, or vomiting.  Your rash is not better after 3 days. MAKE SURE YOU:  Understand these instructions.  Will watch your condition.  Will get help right away if you are not doing well or get worse. Document Released: 11/13/2002 Document Revised: 02/15/2012 Document Reviewed: 09/07/2011 [facility] Patient Information 2015 [place], [place]. This information is not intended to replace advice given to you by your health care provider. Make sure you discuss any questions you have with your health care provider.  Contact Dermatitis Contact dermatitis is a reaction to  certain substances that touch the skin. Contact dermatitis can be either irritant contact dermatitis or allergic contact dermatitis. Irritant contact dermatitis does not require previous exposure to the substance for a reaction to occur.Allergic contact dermatitis only occurs if you have been exposed to the substance before. Upon a repeat exposure, your body reacts to the substance.  CAUSES  Many substances can cause contact dermatitis. Irritant dermatitis is most commonly caused by repeated exposure to mildly irritating substances, such as:  Makeup.  Soaps.  Detergents.  Bleaches.  Acids.  Metal salts, such as nickel. Allergic contact dermatitis is most commonly caused by exposure to:  Poisonous plants.  Chemicals (deodorants, shampoos).  Jewelry.  Latex.  Neomycin in triple antibiotic cream.  Preservatives in products, including clothing. SYMPTOMS  The area of skin that is exposed may develop:  Dryness or flaking.  Redness.  Cracks.  Itching.  Pain or a burning sensation.  Blisters. With allergic contact dermatitis, there may also be swelling in areas such as the eyelids, mouth, or genitals.  DIAGNOSIS  Your caregiver can usually tell what the problem is by doing a physical exam. In cases where the cause is uncertain and an allergic contact dermatitis is suspected, a patch skin test may be performed to help determine the cause of your dermatitis. TREATMENT Treatment includes protecting the skin from further contact with the irritating substance by avoiding that substance if possible. Barrier creams, powders, and gloves may be helpful. Your caregiver may also recommend:  Steroid creams or ointments applied 2 times daily. For best results, soak the rash area in cool water for  20 minutes. Then apply the medicine. Cover the area with a plastic wrap. You can store the steroid cream in the refrigerator for a "chilly" effect on your rash. That may decrease itching. Oral  steroid medicines may be needed in more severe cases.  Antibiotics or antibacterial ointments if a skin infection is present.  Antihistamine lotion or an antihistamine taken by mouth to ease itching.  Lubricants to keep moisture in your skin.  Burow's solution to reduce redness and soreness or to dry a weeping rash. Mix one packet or tablet of solution in 2 cups cool water. Dip a clean washcloth in the mixture, wring it out a bit, and put it on the affected area. Leave the cloth in place for 30 minutes. Do this as often as possible throughout the day.  Taking several cornstarch or baking soda baths daily if the area is too large to cover with a washcloth. Harsh chemicals, such as alkalis or acids, can cause skin damage that is like a burn. You should flush your skin for 15 to 20 minutes with cold water after such an exposure. You should also seek immediate medical care after exposure. Bandages (dressings), antibiotics, and pain medicine may be needed for severely irritated skin.  HOME CARE INSTRUCTIONS  Avoid the substance that caused your reaction.  Keep the area of skin that is affected away from hot water, soap, sunlight, chemicals, acidic substances, or anything else that would irritate your skin.  Do not scratch the rash. Scratching may cause the rash to become infected.  You may take cool baths to help stop the itching.  Only take over-the-counter or prescription medicines as directed by your caregiver.  See your caregiver for follow-up care as directed to make sure your skin is healing properly. SEEK MEDICAL CARE IF:   Your condition is not better after 3 days of treatment.  You seem to be getting worse.  You see signs of infection such as swelling, tenderness, redness, soreness, or warmth in the affected area.  You have any problems related to your medicines. Document Released: 11/20/2000 Document Revised: 02/15/2012 Document Reviewed: 04/28/2011 [facility] Patient  Information 2015 [place], [place]. This information is not intended to replace advice given to you by your health care provider. Make sure you discuss any questions you have with your health care provider.

## 2015-08-01 NOTE — ED Notes (Signed)
Pt was seen here 8/12 for rash. States it seemed to improve for a short time but has now gotten worse. Itching rash to neck and upper back.

## 2015-08-12 ENCOUNTER — Encounter (HOSPITAL_COMMUNITY): Payer: Self-pay | Admitting: Emergency Medicine

## 2015-08-12 ENCOUNTER — Emergency Department (HOSPITAL_COMMUNITY): Payer: No Typology Code available for payment source

## 2015-08-12 ENCOUNTER — Emergency Department (HOSPITAL_COMMUNITY)
Admission: EM | Admit: 2015-08-12 | Discharge: 2015-08-12 | Disposition: A | Discharge status: Home / Self Care | Payer: No Typology Code available for payment source | Attending: Emergency Medicine | Admitting: Emergency Medicine

## 2015-08-12 DIAGNOSIS — Z862 Personal history of diseases of the blood and blood-forming organs and certain disorders involving the immune mechanism: Secondary | ICD-10-CM | POA: Diagnosis not present

## 2015-08-12 DIAGNOSIS — Y9389 Activity, other specified: Secondary | ICD-10-CM | POA: Insufficient documentation

## 2015-08-12 DIAGNOSIS — IMO0002 Reserved for concepts with insufficient information to code with codable children: Secondary | ICD-10-CM

## 2015-08-12 DIAGNOSIS — S96922A Laceration of unspecified muscle and tendon at ankle and foot level, left foot, initial encounter: Secondary | ICD-10-CM | POA: Diagnosis not present

## 2015-08-12 DIAGNOSIS — Z79899 Other long term (current) drug therapy: Secondary | ICD-10-CM | POA: Diagnosis not present

## 2015-08-12 DIAGNOSIS — Y998 Other external cause status: Secondary | ICD-10-CM | POA: Insufficient documentation

## 2015-08-12 DIAGNOSIS — J45909 Unspecified asthma, uncomplicated: Secondary | ICD-10-CM | POA: Diagnosis not present

## 2015-08-12 DIAGNOSIS — Z87891 Personal history of nicotine dependence: Secondary | ICD-10-CM | POA: Insufficient documentation

## 2015-08-12 DIAGNOSIS — W25XXXA Contact with sharp glass, initial encounter: Secondary | ICD-10-CM | POA: Diagnosis not present

## 2015-08-12 DIAGNOSIS — Y9289 Other specified places as the place of occurrence of the external cause: Secondary | ICD-10-CM | POA: Insufficient documentation

## 2015-08-12 DIAGNOSIS — S91012A Laceration without foreign body, left ankle, initial encounter: Secondary | ICD-10-CM

## 2015-08-12 MED ORDER — LIDOCAINE HCL (PF) 1 % IJ SOLN
10.0000 mL | Freq: Once | INTRAMUSCULAR | Status: AC
  Administered 2015-08-12: 10 mL via INTRADERMAL
  Filled 2015-08-12: qty 10

## 2015-08-12 MED ORDER — DIAZEPAM 2 MG PO TABS
2.0000 mg | ORAL_TABLET | Freq: Once | ORAL | Status: AC
  Administered 2015-08-12: 2 mg via ORAL
  Filled 2015-08-12: qty 1

## 2015-08-12 MED ORDER — OXYCODONE-ACETAMINOPHEN 5-325 MG PO TABS: 1.0000 | ORAL_TABLET | Freq: Four times a day (QID) | ORAL | Status: DC | PRN

## 2015-08-12 MED ORDER — LIDOCAINE-EPINEPHRINE 1 %-1:100000 IJ SOLN
INTRAMUSCULAR | Status: AC
  Filled 2015-08-12: qty 1

## 2015-08-12 MED ORDER — OXYCODONE-ACETAMINOPHEN 5-325 MG PO TABS
1.0000 | ORAL_TABLET | Freq: Once | ORAL | Status: AC
  Administered 2015-08-12: 1 via ORAL
  Filled 2015-08-12: qty 1

## 2015-08-12 MED ORDER — LIDOCAINE-EPINEPHRINE 2 %-1:100000 IJ SOLN
30.0000 mL | Freq: Once | INTRAMUSCULAR | Status: AC
  Administered 2015-08-12: 30 mL via INTRADERMAL

## 2015-08-12 MED ORDER — CEPHALEXIN 500 MG PO CAPS: 500.0000 mg | ORAL_CAPSULE | Freq: Three times a day (TID) | ORAL | Status: DC

## 2015-08-12 NOTE — ED Notes (Signed)
Pt comes in today with EMS from home with a c/o left ankle laceration. Pt states that she was moving a glass table when it dropped and shattered. Pt has a bandage covering the area. EMS states that the area was bleeding severely when they arrived and applied a pressure dressing to the site.

## 2015-08-12 NOTE — ED Notes (Signed)
Ortho tech notified.  

## 2015-08-12 NOTE — ED Notes (Signed)
Patient was alert, oriented and stable upon discharge. RN went over AVS and patient had no further questions. Pt wheeled out. Told to call surgeon tomorrow.

## 2015-08-12 NOTE — Discharge Instructions (Signed)
Sutured Wound Care Sutures are stitches that can be used to close wounds. Wound care helps prevent pain and infection.  HOME CARE INSTRUCTIONS   Rest and elevate the injured area until all the pain and swelling are gone.  Only take over-the-counter or prescription medicines for pain, discomfort, or fever as directed by your caregiver.  After 48 hours, gently wash the area with mild soap and water once a day, or as directed. Rinse off the soap. Pat the area dry with a clean towel. Do not rub the wound. This may cause bleeding.  Follow your caregiver's instructions for how often to change the bandage (dressing). Stop using a dressing after 2 days or after the wound stops draining.  If the dressing sticks, moisten it with soapy water and gently remove it.  Apply ointment on the wound as directed.  Avoid stretching a sutured wound.  Drink enough fluids to keep your urine clear or pale yellow.  Follow up with your caregiver for suture removal as directed.  Use sunscreen on your wound for the next 3 to 6 months so the scar will not darken. SEEK IMMEDIATE MEDICAL CARE IF:   Your wound becomes red, swollen, hot, or tender.  You have increasing pain in the wound.  You have a red streak that extends from the wound.  There is pus coming from the wound.  You have a fever.  You have shaking chills.  There is a bad smell coming from the wound.  You have persistent bleeding from the wound. MAKE SURE YOU:   Understand these instructions.  Will watch your condition.  Will get help right away if you are not doing well or get worse. Document Released: 12/31/2004 Document Revised: 02/15/2012 Document Reviewed: 03/29/2011 [facility] Patient Information 2015 [place], [place]. This information is not intended to replace advice given to you by your health care provider. Make sure you discuss any questions you have with your health care provider.  Tendon Injury Tendons are strong, cordlike  structures that connect muscle to bone. Tendons are made up of woven fibers, like a rope. A tendon injury is a tear (rupture) of the tendon. The rupture may be partial (only a few of the fibers in your tendon rupture) or complete (your entire tendon ruptures). CAUSES  Tendon injuries can be caused by high-stress activities, such as sports. They also can be caused by a repetitive injury or by a single injury from an excessive, rapid force. SYMPTOMS  Symptoms of tendon injury include pain when you move the joint close to the tendon. Other symptoms are swelling, redness, and warmth. DIAGNOSIS  Tendon injuries often can be diagnosed by physical exam. However, sometimes an X-ray exam or advanced imaging, such as magnetic resonance imaging (MRI), is necessary to determine the extent of the injury. TREATMENT  Partial tendon ruptures often can be treated with immobilization. A splint, bandage, or removable brace usually is used to immobilize the injured tendon. Most injured tendons need to be immobilized for 1-2 months before they are completely healed. Complete tendon ruptures may require surgical reattachment. Document Released: 12/31/2004 Document Revised: 11/12/2011 Document Reviewed: 02/14/2012 [facility] Patient Information 2015 [place], [place]. This information is not intended to replace advice given to you by your health care provider. Make sure you discuss any questions you have with your health care provider.

## 2015-08-12 NOTE — ED Provider Notes (Signed)
CSN: [id number]     Arrival date & time 08/12/15  1310 History   First MD Initiated Contact with Patient 08/12/15 1505     Chief Complaint  Patient presents with  . Extremity Laceration     (Consider location/radiation/quality/duration/timing/severity/associated sxs/prior Treatment) Patient is a 54 y.o. female presenting with skin laceration. The history is provided by the patient.  Laceration Location:  Foot Length (cm):  6 Depth:  Through underlying tissue Quality: avulsion   Bleeding: controlled   Time since incident:  2 hours Laceration mechanism:  Broken glass Pain details:    Quality:  Aching   Severity:  Moderate   Timing:  Constant   Progression:  Unchanged Relieved by:  Nothing Worsened by:  Nothing tried Ineffective treatments:  None tried Tetanus status:  Up to date   Past Medical History  Diagnosis Date  . Asthma   . Chronic bronchitis   . History of blood transfusion 08/07/2013    "first one I've ever had was today" (08/07/2013)  . Anemia    Past Surgical History  Procedure Laterality Date  . No past surgeries    . Robotic assisted total hysterectomy N/A 05/24/2014    Procedure: ATTEMPTED ROBOTIC ASSISTED TOTAL HYSTERECTOMY ;  Surgeon: Lavonia Drafts, MD;  Location: [facility];  Service: Gynecology;  Laterality: N/A;  wound class contaminated  . Vaginal hysterectomy N/A 05/24/2014    Procedure: HYSTERECTOMY VAGINAL,;  Surgeon: Lavonia Drafts, MD;  Location: [facility];  Service: Gynecology;  Laterality: N/A;  converted to vaginal procedure at 0926  . Cystoscopy N/A 05/24/2014    Procedure: CYSTOSCOPY;  Surgeon: Lavonia Drafts, MD;  Location: [facility];  Service: Gynecology;  Laterality: N/A;   Family History  Problem Relation Age of Onset  . Breast cancer Mother   . Diabetes Mellitus II Father    Social History  Substance Use Topics  . Smoking status: Former Smoker -- 0.50 packs/day for 32 years    Types: Cigarettes    Quit date: 02/14/2014   . Smokeless tobacco: Former Systems developer    Quit date: 02/14/2014  . Alcohol Use: Yes     Comment: 08/07/2013 "holidays, birthdays I'll have a mixed drink or glass of wine"   OB History    Gravida Para Term Preterm AB TAB SAB Ectopic Multiple Living   4 3 3  1 1  0 0 0 3     Review of Systems  Skin: Positive for wound.  All other systems reviewed and are negative.     Allergies  Bee venom  Home Medications   Prior to Admission medications   Medication Sig Start Date End Date Taking? Authorizing Provider  albuterol (PROVENTIL HFA;VENTOLIN HFA) 108 (90 BASE) MCG/ACT inhaler Inhale 2 puffs into the lungs every 6 (six) hours as needed for wheezing or shortness of breath.   Yes Historical Provider, MD  diphenhydrAMINE (SOMINEX) 25 MG tablet Take 25 mg by mouth at bedtime as needed for allergies.   Yes Historical Provider, MD  hydrOXYzine (ATARAX/VISTARIL) 25 MG tablet Take 1 tablet (25 mg total) by mouth every 6 (six) hours. 08/01/15  Yes Delsa Grana, PA-C  ibuprofen (ADVIL,MOTRIN) 800 MG tablet Take 1 tablet (800 mg total) by mouth every 8 (eight) hours as needed (mild pain). 05/25/14  Yes Lavonia Drafts, MD  loratadine (CLARITIN) 10 MG tablet Take 1 tablet (10 mg total) by mouth daily. 08/01/15  Yes Delsa Grana, PA-C  triamcinolone (KENALOG) 0.025 % ointment Apply 1 application topically 2 (two) times daily.  08/01/15  Yes Delsa Grana, PA-C  cephALEXin (KEFLEX) 500 MG capsule Take 1 capsule (500 mg total) by mouth 4 (four) times daily. Patient not taking: Reported on 08/12/2015 07/19/15   Elmyra Ricks Pisciotta, PA-C  famotidine (PEPCID) 20 MG tablet Take 1 tablet (20 mg total) by mouth 2 (two) times daily. Patient not taking: Reported on 08/12/2015 07/19/15   Elmyra Ricks Pisciotta, PA-C  predniSONE (STERAPRED UNI-PAK 21 TAB) 10 MG (21) TBPK tablet Take 1 tablet (10 mg total) by mouth daily. Take 6 tabs by mouth daily  for 2 days, then 5 tabs for 2 days, then 4 tabs for 2 days, then 3 tabs for 2 days, 2 tabs  for 2 days, then 1 tab by mouth daily for 2 days Patient not taking: Reported on 08/12/2015 08/01/15   Delsa Grana, PA-C   BP 127/78 mmHg  Pulse 75  Temp(Src) 98.6 F (37 C) (Oral)  Resp 18  Ht 5\' 6"  (1.676 m)  Wt 190 lb (86.183 kg)  BMI 30.68 kg/m2  SpO2 97%  LMP 02/12/2014 Physical Exam  Constitutional: She is oriented to person, place, and time. She appears well-developed and well-nourished. No distress.  HENT:  Head: Normocephalic.  Eyes: Conjunctivae are normal.  Neck: Neck supple. No tracheal deviation present.  Cardiovascular: Normal rate and regular rhythm.   Pulmonary/Chest: Effort normal. No respiratory distress.  Abdominal: Soft. She exhibits no distension.  Musculoskeletal:       Right ankle: She exhibits laceration (overlying anterior portion of ankle, avulsed with inferior flap, no FB identified).  Sensation in tact, unable to dorsiflex ankle or toes of affected side.   Neurological: She is alert and oriented to person, place, and time.  Skin: Skin is warm and dry.  Psychiatric: She has a normal mood and affect.    ED Course  Procedures (including critical care time) LACERATION REPAIR Performed by: Leo Grosser Authorized by: Leo Grosser Consent: Verbal consent obtained. Risks and benefits: risks, benefits and alternatives were discussed Consent given by: patient Patient identity confirmed: provided demographic data Prepped and Draped in normal sterile fashion Wound explored  Laceration Location: left ankle  Laceration Length: 8 cm  No Foreign Bodies seen or palpated  Anesthesia: local infiltration  Local anesthetic: lidocaine 1% w epinephrine  Anesthetic total: 20 ml  Irrigation method: syringe Amount of cleaning: standard  Skin closure: 4-0 prolene  Number of sutures: 7  Technique: loose approximation, simple interrupted  Patient tolerance: Patient tolerated the procedure well with no immediate complications.   Labs Review Labs  Reviewed - No data to display  Imaging Review Dg Ankle Complete Left  08/12/2015   CLINICAL DATA:  54 year old female with tendon injury to the anterior left ankle. Patient presenting with left ankle laceration.  EXAM: LEFT ANKLE COMPLETE - 3+ VIEW  COMPARISON:  None.  FINDINGS: There is no fracture or dislocation. There is laceration of the skin and soft tissues anterior to the ankle. No radiopaque foreign object identified. A 5 mm calcaneal spur is noted.  IMPRESSION: Laceration of the skin anterior to the ankle. No radiopaque foreign object. No acute fracture.   Electronically Signed   By: Anner Crete M.D.   On: 08/12/2015 18:53   I have personally reviewed and evaluated these images and lab results as part of my medical decision-making.   EKG Interpretation None      MDM   Final diagnoses:  Laceration of ankle, left, initial encounter  Tendon injury    54 y.o. female presents  with laceration from a dropped glass table which shattered. Unable to tolerate exam initially, provided pain meds and anxiolysis prior to local anaesthetic. Following exam, no FB identified but Pt unable to dorsiflex ankle or toes. Unable to palpate base of wound. Discussed with o/c orthopedic surgeon Dr Sharol Given who recommends loose approximation, antibiotic prophylaxis, and placement in fracture boot to see this week for revision and examination of tendons and nerves. Wound repaired as above, information provided for close follow up. Return precautions discussed for signs of infection or worsening.   Follow-up Information    Schedule an appointment as soon as possible for a visit with Newt Minion, MD.   Specialty:  Orthopedic Surgery   Why:  immediately for evaluation and possible operation or exploration of wound   Contact information:   [place] [address] [phone]        Leo Grosser, MD 08/13/15 [phone]

## 2015-08-12 NOTE — ED Notes (Signed)
Bed: WA20 Expected date:  Expected time:  Means of arrival:  Comments: EMS- 54yo F, severe leg lac

## 2015-08-12 NOTE — ED Notes (Signed)
Family provided update on plan of care.  Suture care and Lido at bedside for MD.

## 2015-08-15 ENCOUNTER — Encounter (HOSPITAL_BASED_OUTPATIENT_CLINIC_OR_DEPARTMENT_OTHER): Payer: Self-pay | Admitting: *Deleted

## 2015-08-15 ENCOUNTER — Other Ambulatory Visit (HOSPITAL_BASED_OUTPATIENT_CLINIC_OR_DEPARTMENT_OTHER): Payer: Self-pay | Admitting: Orthopaedic Surgery

## 2015-08-15 NOTE — H&P (Signed)
    PREOPERATIVE H&P  Chief Complaint: left ankle tendon laceration, wound  HPI: Shawna Rodgers is a 54 y.o. female who presents for surgical treatment of left ankle tendon laceration, wound.  She denies any changes in medical history.  Past Medical History  Diagnosis Date  . Asthma   . Chronic bronchitis   . History of blood transfusion 08/07/2013    "first one I've ever had was today" (08/07/2013)  . Anemia    Past Surgical History  Procedure Laterality Date  . Robotic assisted total hysterectomy N/A 05/24/2014    Procedure: ATTEMPTED ROBOTIC ASSISTED TOTAL HYSTERECTOMY ;  Surgeon: Lavonia Drafts, MD;  Location: [facility];  Service: Gynecology;  Laterality: N/A;  wound class contaminated  . Vaginal hysterectomy N/A 05/24/2014    Procedure: HYSTERECTOMY VAGINAL,;  Surgeon: Lavonia Drafts, MD;  Location: [facility];  Service: Gynecology;  Laterality: N/A;  converted to vaginal procedure at 0926  . Cystoscopy N/A 05/24/2014    Procedure: CYSTOSCOPY;  Surgeon: Lavonia Drafts, MD;  Location: [facility];  Service: Gynecology;  Laterality: N/A;   Social History   Social History  . Marital Status: Single    Spouse Name: N/A  . Number of Children: N/A  . Years of Education: N/A   Social History Main Topics  . Smoking status: Former Smoker -- 0.50 packs/day for 32 years    Types: Cigarettes    Quit date: 02/14/2014  . Smokeless tobacco: Former Systems developer    Quit date: 02/14/2014  . Alcohol Use: Yes     Comment: 08/07/2013 "holidays, birthdays I'll have a mixed drink or glass of wine"  . Drug Use: No  . Sexual Activity: Yes    Birth Control/ Protection: Surgical   Other Topics Concern  . None   Social History Narrative   Family History  Problem Relation Age of Onset  . Breast cancer Mother   . Diabetes Mellitus II Father    Allergies  Allergen Reactions  . Bee Venom Hives and Rash   Prior to Admission medications   Medication Sig Start Date End Date Taking?  Authorizing Provider  oxyCODONE-acetaminophen (PERCOCET/ROXICET) 5-325 MG per tablet Take 1 tablet by mouth every 6 (six) hours as needed for severe pain. 08/12/15  Yes Leo Grosser, MD  triamcinolone (KENALOG) 0.025 % ointment Apply 1 application topically 2 (two) times daily. 08/01/15   Delsa Grana, PA-C     Positive ROS: All other systems have been reviewed and were otherwise negative with the exception of those mentioned in the HPI and as above.  Physical Exam: General: Alert, no acute distress Cardiovascular: No pedal edema Respiratory: No cyanosis, no use of accessory musculature GI: abdomen soft Skin: No lesions in the area of chief complaint Neurologic: Sensation intact distally Psychiatric: Patient is competent for consent with normal mood and affect Lymphatic: no lymphedema  MUSCULOSKELETAL: exam stable  Assessment: left ankle tendon laceration, wound  Plan: Plan for Procedure(s): IRRIGATION AND DEBRIDEMENT LEFT ANKLE, EXPLORATION, REPAIR AS NEEDED TENDON REPAIR  The risks benefits and alternatives were discussed with the patient including but not limited to the risks of nonoperative treatment, versus surgical intervention including infection, bleeding, nerve injury,  blood clots, cardiopulmonary complications, morbidity, mortality, among others, and they were willing to proceed.   Marianna Payment, MD   08/15/2015 8:57 PM

## 2015-08-16 ENCOUNTER — Encounter (HOSPITAL_BASED_OUTPATIENT_CLINIC_OR_DEPARTMENT_OTHER): Payer: Self-pay | Admitting: *Deleted

## 2015-08-16 ENCOUNTER — Ambulatory Visit (HOSPITAL_BASED_OUTPATIENT_CLINIC_OR_DEPARTMENT_OTHER): Payer: No Typology Code available for payment source | Admitting: Anesthesiology

## 2015-08-16 ENCOUNTER — Encounter (HOSPITAL_BASED_OUTPATIENT_CLINIC_OR_DEPARTMENT_OTHER)
Admission: RE | Disposition: A | Discharge status: Home / Self Care | Payer: Self-pay | Source: Ambulatory Visit | Attending: Orthopaedic Surgery

## 2015-08-16 ENCOUNTER — Ambulatory Visit (HOSPITAL_BASED_OUTPATIENT_CLINIC_OR_DEPARTMENT_OTHER)
Admission: RE | Admit: 2015-08-16 | Discharge: 2015-08-16 | Disposition: A | Discharge status: Home / Self Care | Payer: No Typology Code available for payment source | Source: Ambulatory Visit | Attending: Orthopaedic Surgery | Admitting: Orthopaedic Surgery

## 2015-08-16 DIAGNOSIS — X58XXXA Exposure to other specified factors, initial encounter: Secondary | ICD-10-CM | POA: Diagnosis not present

## 2015-08-16 DIAGNOSIS — S96822A Laceration of other specified muscles and tendons at ankle and foot level, left foot, initial encounter: Secondary | ICD-10-CM | POA: Insufficient documentation

## 2015-08-16 DIAGNOSIS — Z87891 Personal history of nicotine dependence: Secondary | ICD-10-CM | POA: Insufficient documentation

## 2015-08-16 DIAGNOSIS — S91012A Laceration without foreign body, left ankle, initial encounter: Secondary | ICD-10-CM

## 2015-08-16 HISTORY — PX: I&D EXTREMITY: SHX5045

## 2015-08-16 HISTORY — PX: TENDON REPAIR: SHX5111

## 2015-08-16 SURGERY — IRRIGATION AND DEBRIDEMENT EXTREMITY
Anesthesia: General | Site: Ankle | Laterality: Left

## 2015-08-16 MED ORDER — BUPIVACAINE-EPINEPHRINE (PF) 0.5% -1:200000 IJ SOLN
INTRAMUSCULAR | Status: DC | PRN
  Administered 2015-08-16: 30 mL via PERINEURAL

## 2015-08-16 MED ORDER — LACTATED RINGERS IV SOLN
INTRAVENOUS | Status: DC
  Administered 2015-08-16: 10:00:00 via INTRAVENOUS

## 2015-08-16 MED ORDER — FENTANYL CITRATE (PF) 100 MCG/2ML IJ SOLN
50.0000 ug | INTRAMUSCULAR | Status: DC | PRN
  Administered 2015-08-16: 50 ug via INTRAVENOUS

## 2015-08-16 MED ORDER — DEXAMETHASONE SODIUM PHOSPHATE 10 MG/ML IJ SOLN
INTRAMUSCULAR | Status: AC
  Filled 2015-08-16: qty 1

## 2015-08-16 MED ORDER — MIDAZOLAM HCL 2 MG/2ML IJ SOLN
1.0000 mg | INTRAMUSCULAR | Status: DC | PRN
  Administered 2015-08-16: 2 mg via INTRAVENOUS
  Administered 2015-08-16: 1 mg via INTRAVENOUS

## 2015-08-16 MED ORDER — PROMETHAZINE HCL 25 MG/ML IJ SOLN: 6.2500 mg | INTRAMUSCULAR | Status: DC | PRN

## 2015-08-16 MED ORDER — MIDAZOLAM HCL 2 MG/2ML IJ SOLN
INTRAMUSCULAR | Status: AC
  Filled 2015-08-16: qty 4

## 2015-08-16 MED ORDER — HYDROCODONE-ACETAMINOPHEN 7.5-325 MG PO TABS: 1.0000 | ORAL_TABLET | Freq: Four times a day (QID) | ORAL | Status: DC | PRN

## 2015-08-16 MED ORDER — CEFAZOLIN SODIUM-DEXTROSE 2-3 GM-% IV SOLR
2.0000 g | INTRAVENOUS | Status: AC
  Administered 2015-08-16: 2 g via INTRAVENOUS

## 2015-08-16 MED ORDER — HYDROMORPHONE HCL 1 MG/ML IJ SOLN: 0.2500 mg | INTRAMUSCULAR | Status: DC | PRN

## 2015-08-16 MED ORDER — ONDANSETRON HCL 4 MG/2ML IJ SOLN
INTRAMUSCULAR | Status: AC
  Filled 2015-08-16: qty 2

## 2015-08-16 MED ORDER — ONDANSETRON HCL 4 MG/2ML IJ SOLN
INTRAMUSCULAR | Status: DC | PRN
  Administered 2015-08-16: 4 mg via INTRAVENOUS

## 2015-08-16 MED ORDER — ASPIRIN EC 325 MG PO TBEC: 325.0000 mg | DELAYED_RELEASE_TABLET | Freq: Two times a day (BID) | ORAL | Status: DC

## 2015-08-16 MED ORDER — GLYCOPYRROLATE 0.2 MG/ML IJ SOLN: 0.2000 mg | Freq: Once | INTRAMUSCULAR | Status: DC | PRN

## 2015-08-16 MED ORDER — SCOPOLAMINE 1 MG/3DAYS TD PT72: 1.0000 | MEDICATED_PATCH | Freq: Once | TRANSDERMAL | Status: DC | PRN

## 2015-08-16 MED ORDER — MIDAZOLAM HCL 2 MG/2ML IJ SOLN
INTRAMUSCULAR | Status: AC
  Filled 2015-08-16: qty 2

## 2015-08-16 MED ORDER — CEFAZOLIN SODIUM-DEXTROSE 2-3 GM-% IV SOLR
INTRAVENOUS | Status: AC
  Filled 2015-08-16: qty 50

## 2015-08-16 MED ORDER — FENTANYL CITRATE (PF) 100 MCG/2ML IJ SOLN
INTRAMUSCULAR | Status: AC
  Filled 2015-08-16: qty 4

## 2015-08-16 MED ORDER — PROPOFOL 10 MG/ML IV BOLUS
INTRAVENOUS | Status: DC | PRN
  Administered 2015-08-16: 200 mg via INTRAVENOUS

## 2015-08-16 MED ORDER — DEXAMETHASONE SODIUM PHOSPHATE 4 MG/ML IJ SOLN
INTRAMUSCULAR | Status: DC | PRN
  Administered 2015-08-16: 10 mg via INTRAVENOUS

## 2015-08-16 MED ORDER — LIDOCAINE HCL (CARDIAC) 20 MG/ML IV SOLN
INTRAVENOUS | Status: AC
  Filled 2015-08-16: qty 5

## 2015-08-16 MED ORDER — LIDOCAINE HCL (CARDIAC) 20 MG/ML IV SOLN
INTRAVENOUS | Status: DC | PRN
  Administered 2015-08-16: 50 mg via INTRAVENOUS

## 2015-08-16 MED ORDER — PROPOFOL 10 MG/ML IV BOLUS
INTRAVENOUS | Status: AC
  Filled 2015-08-16: qty 20

## 2015-08-16 MED ORDER — FENTANYL CITRATE (PF) 100 MCG/2ML IJ SOLN
INTRAMUSCULAR | Status: AC
  Filled 2015-08-16: qty 2

## 2015-08-16 MED ORDER — SODIUM CHLORIDE 0.9 % IR SOLN
Status: DC | PRN
  Administered 2015-08-16: 1000 mL

## 2015-08-16 SURGICAL SUPPLY — 79 items
BANDAGE ELASTIC 4 VELCRO ST LF (GAUZE/BANDAGES/DRESSINGS) ×3 IMPLANT
BANDAGE ELASTIC 6 VELCRO ST LF (GAUZE/BANDAGES/DRESSINGS) ×3 IMPLANT
BANDAGE ESMARK 6X9 LF (GAUZE/BANDAGES/DRESSINGS) ×1 IMPLANT
BLADE HEX COATED 2.75 (ELECTRODE) ×3 IMPLANT
BLADE SURG 10 STRL SS (BLADE) IMPLANT
BLADE SURG 15 STRL LF DISP TIS (BLADE) ×2 IMPLANT
BLADE SURG 15 STRL SS (BLADE) ×4
BNDG ESMARK 6X9 LF (GAUZE/BANDAGES/DRESSINGS) ×3
BRUSH SCRUB EZ PLAIN DRY (MISCELLANEOUS) IMPLANT
CANISTER SUCT 1200ML W/VALVE (MISCELLANEOUS) IMPLANT
COVER BACK TABLE 60X90IN (DRAPES) ×3 IMPLANT
CUFF TOURNIQUET SINGLE 34IN LL (TOURNIQUET CUFF) IMPLANT
DECANTER SPIKE VIAL GLASS SM (MISCELLANEOUS) IMPLANT
DERMABOND ADVANCED (GAUZE/BANDAGES/DRESSINGS)
DERMABOND ADVANCED .7 DNX12 (GAUZE/BANDAGES/DRESSINGS) IMPLANT
DRAIN TLS ROUND 10FR (DRAIN) IMPLANT
DRAPE EXTREMITY T 121X128X90 (DRAPE) ×3 IMPLANT
DRAPE SURG 17X23 STRL (DRAPES) ×3 IMPLANT
DRAPE U 20/CS (DRAPES) ×3 IMPLANT
DRAPE U-SHAPE 47X51 STRL (DRAPES) IMPLANT
DURAPREP 26ML APPLICATOR (WOUND CARE) ×3 IMPLANT
ELECT REM PT RETURN 9FT ADLT (ELECTROSURGICAL) ×3
ELECTRODE REM PT RTRN 9FT ADLT (ELECTROSURGICAL) ×1 IMPLANT
GAUZE SPONGE 4X4 12PLY STRL (GAUZE/BANDAGES/DRESSINGS) ×3 IMPLANT
GAUZE SPONGE 4X4 16PLY XRAY LF (GAUZE/BANDAGES/DRESSINGS) IMPLANT
GAUZE XEROFORM 1X8 LF (GAUZE/BANDAGES/DRESSINGS) IMPLANT
GLOVE NEODERM STRL 7.5 LF PF (GLOVE) ×1 IMPLANT
GLOVE SURG NEODERM 7.5  LF PF (GLOVE) ×2
GLOVE SURG SYN 7.5  E (GLOVE) ×2
GLOVE SURG SYN 7.5 E (GLOVE) ×1 IMPLANT
GOWN STRL REIN XL XLG (GOWN DISPOSABLE) ×3 IMPLANT
GOWN STRL REUS W/ TWL LRG LVL3 (GOWN DISPOSABLE) ×1 IMPLANT
GOWN STRL REUS W/TWL LRG LVL3 (GOWN DISPOSABLE) ×2
HANDPIECE INTERPULSE COAX TIP (DISPOSABLE)
KNEE WRAP E Z 3 GEL PACK (MISCELLANEOUS) IMPLANT
MANIFOLD NEPTUNE II (INSTRUMENTS) ×3 IMPLANT
NS IRRIG 1000ML POUR BTL (IV SOLUTION) ×3 IMPLANT
PACK BASIN DAY SURGERY FS (CUSTOM PROCEDURE TRAY) ×3 IMPLANT
PAD CAST 4YDX4 CTTN HI CHSV (CAST SUPPLIES) ×1 IMPLANT
PADDING CAST COTTON 4X4 STRL (CAST SUPPLIES) ×2
PADDING CAST COTTON 6X4 STRL (CAST SUPPLIES) ×3 IMPLANT
PADDING CAST SYN 6 (CAST SUPPLIES)
PADDING CAST SYNTHETIC 4 (CAST SUPPLIES)
PADDING CAST SYNTHETIC 4X4 STR (CAST SUPPLIES) IMPLANT
PADDING CAST SYNTHETIC 6X4 NS (CAST SUPPLIES) IMPLANT
PASSER SUT SWANSON 36MM LOOP (INSTRUMENTS) IMPLANT
PENCIL BUTTON HOLSTER BLD 10FT (ELECTRODE) ×3 IMPLANT
RETRIEVER SUT HEWSON (MISCELLANEOUS) IMPLANT
SET HNDPC FAN SPRY TIP SCT (DISPOSABLE) IMPLANT
SET IRRIG Y TYPE TUR BLADDER L (SET/KITS/TRAYS/PACK) ×3 IMPLANT
SHEET MEDIUM DRAPE 40X70 STRL (DRAPES) IMPLANT
SLEEVE SCD COMPRESS KNEE MED (MISCELLANEOUS) IMPLANT
SPLINT FIBERGLASS 4X30 (CAST SUPPLIES) ×3 IMPLANT
SPONGE LAP 18X18 X RAY DECT (DISPOSABLE) ×3 IMPLANT
STAPLER VISISTAT (STAPLE) IMPLANT
STOCKINETTE 4X48 STRL (DRAPES) IMPLANT
SUCTION FRAZIER TIP 10 FR DISP (SUCTIONS) ×3 IMPLANT
SUT ETHIBOND 0 MO6 C/R (SUTURE) IMPLANT
SUT ETHIBOND 2 OS 4 DA (SUTURE) IMPLANT
SUT ETHIBOND 2-0 V-5 NEEDLE (SUTURE) ×9 IMPLANT
SUT ETHILON 3 0 PS 1 (SUTURE) ×3 IMPLANT
SUT FIBERWIRE 2-0 18 17.9 3/8 (SUTURE)
SUT PROLENE 4 0 P 3 18 (SUTURE) ×3 IMPLANT
SUT PROLENE 5 0 PS 2 (SUTURE) ×3 IMPLANT
SUT VIC AB 0 CT1 27 (SUTURE)
SUT VIC AB 0 CT1 27XBRD ANBCTR (SUTURE) IMPLANT
SUT VIC AB 0 SH 27 (SUTURE) IMPLANT
SUT VIC AB 2-0 SH 27 (SUTURE) ×6
SUT VIC AB 2-0 SH 27XBRD (SUTURE) ×3 IMPLANT
SUTURE FIBERWR 2-0 18 17.9 3/8 (SUTURE) IMPLANT
SWAB COLLECTION DEVICE MRSA (MISCELLANEOUS) IMPLANT
SYR BULB 3OZ (MISCELLANEOUS) IMPLANT
TOWEL OR 17X24 6PK STRL BLUE (TOWEL DISPOSABLE) ×6 IMPLANT
TOWEL OR NON WOVEN STRL DISP B (DISPOSABLE) ×3 IMPLANT
TRAY DSU PREP LF (CUSTOM PROCEDURE TRAY) IMPLANT
TUBE ANAEROBIC SPECIMEN COL (MISCELLANEOUS) IMPLANT
TUBE CONNECTING 20'X1/4 (TUBING) ×1
TUBE CONNECTING 20X1/4 (TUBING) ×2 IMPLANT
YANKAUER SUCT BULB TIP NO VENT (SUCTIONS) ×3 IMPLANT

## 2015-08-16 NOTE — H&P (Signed)

## 2015-08-16 NOTE — Progress Notes (Signed)
Assisted Dr. Turk with left, ultrasound guided, popliteal/saphenous block. Side rails up, monitors on throughout procedure. See vital signs in flow sheet. Tolerated Procedure well. 

## 2015-08-16 NOTE — Brief Op Note (Signed)
   Brief Op Note  Date of Surgery: 08/16/2015  Preoperative Diagnosis: left ankle tendon laceration, wound  Postoperative Diagnosis: same  Procedure: Procedure(s): IRRIGATION AND DEBRIDEMENT LEFT ANKLE, EXPLORATION, EXTENSOR TENDON REPAIR x 5  Implants: none  Surgeons: Surgeon(s): Naiping Ephriam Jenkins, MD  Anesthesia: General  Drains: none  Estimated Blood Loss: See anesthesia record  Complications: None  Condition to PACU: Stable  Naiping Eduard Roux, MD [place] 08/16/2015 1:09 PM

## 2015-08-16 NOTE — Op Note (Signed)
   Date of Surgery: 08/16/2015  INDICATIONS: Shawna Rodgers is a 54 y.o.-year-old female who sustained a left ankle laceration with tendon involvement from a large piece of glass and had clinical signs of complete laceration of the tendons;  The patient did consent to the procedure after discussion of the risks and benefits.  PREOPERATIVE DIAGNOSIS: Left ankle traumatic laceration with complete laceration of extensor tendons  POSTOPERATIVE DIAGNOSIS: Same.  OPERATIVE FINDINGS: 1. Complete laceration of tibialis anterior 2. Complete laceration of all 4 EDL tendons 3. Intact EHL tendon and neurovascular bundle  PROCEDURE:  1. Repair of left ankle extensor tendons x 5 2. Irrigation and debridement of left ankle muscle, tendon, skin 15 x 10 cm 3. Complex wound repair deep, left ankle 15 cm  SURGEON: N. Eduard Roux, M.D.  ASSIST: none.  ANESTHESIA:  general, regional  IV FLUIDS AND URINE: See anesthesia.  ESTIMATED BLOOD LOSS: minimal mL.  IMPLANTS: none  DRAINS: none  COMPLICATIONS: None.  DESCRIPTION OF PROCEDURE: The patient was brought to the operating room and placed supine on the operating table.  The patient had been signed prior to the procedure and this was documented. The patient had the anesthesia placed by the anesthesiologist.  A time-out was performed to confirm that this was the correct patient, site, side and location. The patient had an SCD on the opposite lower extremity. The patient did receive antibiotics prior to the incision and was re-dosed during the procedure as needed at indicated intervals.  The patient had the operative extremity prepped and draped in the standard surgical fashion.    The sutures were removed and the traumatic laceration was extended anteromedially.  The wound was inspected and complete laceration of her tibialis anterior and EDL tendons was evident.  Her neurovascular bundle and EHL tendon were intact.  The extensor retinaculum was incised in  order to deliver the EDL tendons.  We then performed sharp excisional debridement of the muscle, tendon, and skin with a knife.  We then thoroughly irrigated the wound with normal saline.  We then repaired each extensor tendon with two 2.0 ethibond using a modified kessler stitich and a running 5-0 prolene.  We were able to achieve approximation of the stumps.  Once the tendons were repaired, I repaired the extensor retinaculum with 2.0 ethibond.  The foot was placed in 5 degrees of dorsiflexion.  Complex wound repair was performed in order to close the traumatic wound.  I used 2.0 vicryl and 3.0 nylon.  Sterile dressings were applied.  The foot was immobilized in a short leg splint.  The patient tolerated the procedure well  POSTOPERATIVE PLAN: Patient will be nonweight bearing to the left lower extremity.  She will follow up in the office in 2 weeks for a wound check.  Shawna Cecil, MD [place] 5:49 PM

## 2015-08-16 NOTE — Discharge Instructions (Signed)
° ° °

## 2015-08-16 NOTE — Anesthesia Procedure Notes (Addendum)
Anesthesia Regional Block:  Adductor canal block  Pre-Anesthetic Checklist: ,, timeout performed, Correct Patient, Correct Site, Correct Laterality, Correct Procedure, Correct Position, site marked, Risks and benefits discussed,  Surgical consent,  Pre-op evaluation,  At surgeon's request and post-op pain management  Laterality: Left  Prep: chloraprep       Needles:  Injection technique: Single-shot  Needle Type: Echogenic Needle     Needle Length: 9cm 9 cm Needle Gauge: 21 and 21 G    Additional Needles:  Procedures: ultrasound guided (picture in chart) Adductor canal block Narrative:  Start time: 08/16/2015 11:10 AM End time: 08/16/2015 11:23 AM  Performed by: Personally  Anesthesiologist: Catalina Gravel  Additional Notes: No pain on injection. No increased resistance to injection. Injection made in 5cc increments.  Good needle visualization.  Patient tolerated procedure well.   Anesthesia Regional Block:  Popliteal block  Pre-Anesthetic Checklist: ,, timeout performed, Correct Patient, Correct Site, Correct Laterality, Correct Procedure, Correct Position, site marked, Risks and benefits discussed,  Surgical consent,  Pre-op evaluation,  At surgeon's request and post-op pain management  Laterality: Left  Prep: chloraprep       Needles:  Injection technique: Single-shot  Needle Type: Echogenic Needle     Needle Length: 9cm 9 cm Needle Gauge: 21 and 21 G    Additional Needles:  Procedures: ultrasound guided (picture in chart) Popliteal block Narrative:  Start time: 08/16/2015 11:23 AM End time: 08/16/2015 11:26 AM Injection made incrementally with aspirations every 5 mL.  Performed by: Personally  Anesthesiologist: Catalina Gravel  Additional Notes: No pain on injection. No increased resistance to injection. Injection made in 5cc increments.  Good needle visualization.  Patient tolerated procedure well.   Procedure Name: LMA Insertion Performed  by: Terrance Mass Pre-anesthesia Checklist: Patient identified, Timeout performed, Emergency Drugs available, Suction available and Patient being monitored Patient Re-evaluated:Patient Re-evaluated prior to inductionOxygen Delivery Method: Circle system utilized Preoxygenation: Pre-oxygenation with 100% oxygen Intubation Type: IV induction Ventilation: Mask ventilation without difficulty LMA: LMA inserted LMA Size: 4.0 Tube type: Oral Number of attempts: 2 Tube secured with: Tape Dental Injury: Teeth and Oropharynx as per pre-operative assessment

## 2015-08-16 NOTE — Anesthesia Postprocedure Evaluation (Signed)
  Anesthesia Post-op Note  Patient: Shawna Rodgers  Procedure(s) Performed: Procedure(s) (LRB): IRRIGATION AND DEBRIDEMENT LEFT ANKLE AND EXPLORATION (Left) TENDON REPAIR (Left)  Patient Location: PACU  Anesthesia Type: GA combined with regional for post-op pain  Level of Consciousness: awake and alert   Airway and Oxygen Therapy: Patient Spontanous Breathing  Post-op Pain: mild  Post-op Assessment: Post-op Vital signs reviewed, Patient's Cardiovascular Status Stable, Respiratory Function Stable, Patent Airway and No signs of Nausea or vomiting  Last Vitals:  Filed Vitals:   08/16/15 1315  BP: 139/73  Pulse: 80  Temp:   Resp: 19    Post-op Vital Signs: stable   Complications: No apparent anesthesia complications

## 2015-08-16 NOTE — Anesthesia Preprocedure Evaluation (Addendum)
Anesthesia Evaluation  Patient identified by MRN, date of birth, ID band Patient awake    Reviewed: Allergy & Precautions, NPO status , Patient's Chart, lab work & pertinent test results  History of Anesthesia Complications Negative for: history of anesthetic complications  Airway Mallampati: II  TM Distance: >3 FB Neck ROM: Full    Dental  (+) Dental Advisory Given, Missing, Poor Dentition,    Pulmonary asthma , Current Smoker, former smoker,    Pulmonary exam normal breath sounds clear to auscultation       Cardiovascular Exercise Tolerance: Good (-) hypertension(-) angina(-) Past MI negative cardio ROS Normal cardiovascular exam Rhythm:Regular Rate:Normal     Neuro/Psych negative neurological ROS     GI/Hepatic negative GI ROS, Neg liver ROS,   Endo/Other  obesity  Renal/GU negative Renal ROS     Musculoskeletal negative musculoskeletal ROS (+)   Abdominal   Peds  Hematology negative hematology ROS (+) Blood dyscrasia, anemia ,   Anesthesia Other Findings Day of surgery medications reviewed with the patient.  Reproductive/Obstetrics                           Anesthesia Physical Anesthesia Plan  ASA: II  Anesthesia Plan: General   Post-op Pain Management: GA combined w/ Regional for post-op pain   Induction: Intravenous  Airway Management Planned: LMA  Additional Equipment:   Intra-op Plan:   Post-operative Plan: Extubation in OR  Informed Consent: I have reviewed the patients History and Physical, chart, labs and discussed the procedure including the risks, benefits and alternatives for the proposed anesthesia with the patient or authorized representative who has indicated his/her understanding and acceptance.   Dental advisory given  Plan Discussed with: CRNA  Anesthesia Plan Comments: (Risks/benefits of general anesthesia discussed with patient including risk of  damage to teeth, lips, gum, and tongue, nausea/vomiting, allergic reactions to medications, and the possibility of heart attack, stroke and death.  All patient questions answered.  Patient wishes to proceed.)       Anesthesia Quick Evaluation

## 2015-08-16 NOTE — Transfer of Care (Signed)
Immediate Anesthesia Transfer of Care Note  Patient: Shawna Rodgers  Procedure(s) Performed: Procedure(s): IRRIGATION AND DEBRIDEMENT LEFT ANKLE, EXPLORATION, REPAIR AS NEEDED (Left) TENDON REPAIR (Left)  Patient Location: PACU  Anesthesia Type:General  Level of Consciousness: awake and sedated  Airway & Oxygen Therapy: Patient Spontanous Breathing and Patient connected to face mask oxygen  Post-op Assessment: Report given to RN and Post -op Vital signs reviewed and stable  Post vital signs: Reviewed and stable  Last Vitals:  Filed Vitals:   08/16/15 1128  BP:   Pulse: 73  Temp:   Resp: 15    Complications: No apparent anesthesia complications

## 2015-08-19 ENCOUNTER — Encounter (HOSPITAL_BASED_OUTPATIENT_CLINIC_OR_DEPARTMENT_OTHER): Payer: Self-pay | Admitting: Orthopaedic Surgery

## 2016-02-17 ENCOUNTER — Emergency Department (HOSPITAL_COMMUNITY)
Admission: EM | Admit: 2016-02-17 | Discharge: 2016-02-18 | Disposition: A | Discharge status: Home / Self Care | Payer: No Typology Code available for payment source | Attending: Emergency Medicine | Admitting: Emergency Medicine

## 2016-02-17 ENCOUNTER — Emergency Department (HOSPITAL_COMMUNITY): Payer: No Typology Code available for payment source

## 2016-02-17 ENCOUNTER — Encounter (HOSPITAL_COMMUNITY): Payer: Self-pay | Admitting: Emergency Medicine

## 2016-02-17 DIAGNOSIS — J45909 Unspecified asthma, uncomplicated: Secondary | ICD-10-CM | POA: Insufficient documentation

## 2016-02-17 DIAGNOSIS — R0981 Nasal congestion: Secondary | ICD-10-CM | POA: Insufficient documentation

## 2016-02-17 DIAGNOSIS — R000 Tachycardia, unspecified: Secondary | ICD-10-CM | POA: Insufficient documentation

## 2016-02-17 DIAGNOSIS — R05 Cough: Secondary | ICD-10-CM | POA: Insufficient documentation

## 2016-02-17 DIAGNOSIS — R21 Rash and other nonspecific skin eruption: Secondary | ICD-10-CM | POA: Insufficient documentation

## 2016-02-17 DIAGNOSIS — Z87891 Personal history of nicotine dependence: Secondary | ICD-10-CM | POA: Insufficient documentation

## 2016-02-17 DIAGNOSIS — Z7952 Long term (current) use of systemic steroids: Secondary | ICD-10-CM | POA: Insufficient documentation

## 2016-02-17 DIAGNOSIS — R6889 Other general symptoms and signs: Secondary | ICD-10-CM

## 2016-02-17 DIAGNOSIS — Z7982 Long term (current) use of aspirin: Secondary | ICD-10-CM | POA: Insufficient documentation

## 2016-02-17 DIAGNOSIS — R079 Chest pain, unspecified: Secondary | ICD-10-CM | POA: Insufficient documentation

## 2016-02-17 DIAGNOSIS — Z862 Personal history of diseases of the blood and blood-forming organs and certain disorders involving the immune mechanism: Secondary | ICD-10-CM | POA: Insufficient documentation

## 2016-02-17 LAB — I-STAT CG4 LACTIC ACID, ED
Lactic Acid, Venous: 0.34 mmol/L — ABNORMAL LOW (ref 0.5–2.0)
Lactic Acid, Venous: 2.1 mmol/L (ref 0.5–2.0)

## 2016-02-17 LAB — CBC
HEMATOCRIT: 41.8 % (ref 36.0–46.0)
HEMOGLOBIN: 13.5 g/dL (ref 12.0–15.0)
MCH: 30.9 pg (ref 26.0–34.0)
MCHC: 32.3 g/dL (ref 30.0–36.0)
MCV: 95.7 fL (ref 78.0–100.0)
Platelets: 194 10*3/uL (ref 150–400)
RBC: 4.37 MIL/uL (ref 3.87–5.11)
RDW: 12.3 % (ref 11.5–15.5)
WBC: 9.7 10*3/uL (ref 4.0–10.5)

## 2016-02-17 LAB — COMPREHENSIVE METABOLIC PANEL
ALBUMIN: 3.4 g/dL — AB (ref 3.5–5.0)
ALK PHOS: 45 U/L (ref 38–126)
ALT: 19 U/L (ref 14–54)
ANION GAP: 13 (ref 5–15)
AST: 24 U/L (ref 15–41)
BILIRUBIN TOTAL: 0.5 mg/dL (ref 0.3–1.2)
BUN: <5 mg/dL — ABNORMAL LOW (ref 6–20)
CALCIUM: 8.9 mg/dL (ref 8.9–10.3)
CO2: 26 mmol/L (ref 22–32)
CREATININE: 0.99 mg/dL (ref 0.44–1.00)
Chloride: 97 mmol/L — ABNORMAL LOW (ref 101–111)
GFR calc Af Amer: >60 mL/min (ref >60)
GFR calc non Af Amer: >60 mL/min (ref >60)
GLUCOSE: 173 mg/dL — AB (ref 65–99)
Potassium: 3.5 mmol/L (ref 3.5–5.1)
Sodium: 136 mmol/L (ref 135–145)
TOTAL PROTEIN: 7.4 g/dL (ref 6.5–8.1)

## 2016-02-17 LAB — TROPONIN I: Troponin I: <0.03 ng/mL (ref <0.031)

## 2016-02-17 MED ORDER — ACETAMINOPHEN 325 MG PO TABS
650.0000 mg | ORAL_TABLET | Freq: Once | ORAL | Status: AC | PRN
  Administered 2016-02-17: 650 mg via ORAL
  Filled 2016-02-17: qty 2

## 2016-02-17 MED ORDER — SODIUM CHLORIDE 0.9 % IV BOLUS (SEPSIS)
1000.0000 mL | Freq: Once | INTRAVENOUS | Status: AC
  Administered 2016-02-17: 1000 mL via INTRAVENOUS

## 2016-02-17 MED ORDER — ONDANSETRON HCL 4 MG/2ML IJ SOLN
4.0000 mg | Freq: Once | INTRAMUSCULAR | Status: AC
  Administered 2016-02-17: 4 mg via INTRAVENOUS
  Filled 2016-02-17: qty 2

## 2016-02-17 MED ORDER — TRIAMCINOLONE ACETONIDE 0.1 % EX LOTN: 1.0000 "application " | TOPICAL_LOTION | Freq: Three times a day (TID) | CUTANEOUS | Status: DC

## 2016-02-17 MED ORDER — GUAIFENESIN ER 600 MG PO TB12: 600.0000 mg | ORAL_TABLET | Freq: Two times a day (BID) | ORAL | Status: DC | PRN

## 2016-02-17 MED ORDER — KETOROLAC TROMETHAMINE 30 MG/ML IJ SOLN
30.0000 mg | Freq: Once | INTRAMUSCULAR | Status: AC
  Administered 2016-02-17: 30 mg via INTRAVENOUS
  Filled 2016-02-17: qty 1

## 2016-02-17 MED ORDER — ALBUTEROL SULFATE HFA 108 (90 BASE) MCG/ACT IN AERS: 1.0000 | INHALATION_SPRAY | Freq: Four times a day (QID) | RESPIRATORY_TRACT | Status: DC | PRN

## 2016-02-17 MED ORDER — BENZONATATE 100 MG PO CAPS: 100.0000 mg | ORAL_CAPSULE | Freq: Three times a day (TID) | ORAL | Status: DC

## 2016-02-17 MED ORDER — IBUPROFEN 800 MG PO TABS: 800.0000 mg | ORAL_TABLET | Freq: Three times a day (TID) | ORAL | Status: DC

## 2016-02-17 NOTE — Discharge Instructions (Signed)
Take medications as prescribed. Return to the emergency room for worsening condition or new concerning symptoms. Follow up with your regular doctor. If you don't have a regular doctor use one of the numbers below to establish a primary care doctor. ° ° °Emergency Department Resource Guide °1) Find a Doctor and Pay Out of Pocket °Although you won't have to find out who is covered by your insurance plan, it is a good idea to ask around and get recommendations. You will then need to call the office and see if the doctor you have chosen will accept you as a new patient and what types of options they offer for patients who are self-pay. Some doctors offer discounts or will set up payment plans for their patients who do not have insurance, but you will need to ask so you aren't surprised when you get to your appointment. ° °2) Contact Your Local Health Department °Not all health departments have doctors that can see patients for sick visits, but many do, so it is worth a call to see if yours does. If you don't know where your local health department is, you can check in your phone book. The CDC also has a tool to help you locate your state's health department, and many state websites also have listings of all of their local health departments. ° °3) Find a Walk-in Clinic °If your illness is not likely to be very severe or complicated, you may want to try a walk in clinic. These are popping up all over the country in pharmacies, drugstores, and shopping centers. They're usually staffed by nurse practitioners or physician assistants that have been trained to treat common illnesses and complaints. They're usually fairly quick and inexpensive. However, if you have serious medical issues or chronic medical problems, these are probably not your best option. ° °No Primary Care Doctor: °- Call [facility] at  832-8000 - they can help you locate a primary care doctor that  accepts your insurance, provides certain services,  etc. °- Physician Referral Service- 1-800-533-3463 ° °Emergency Department Resource Guide °1) Find a Doctor and Pay Out of Pocket °Although you won't have to find out who is covered by your insurance plan, it is a good idea to ask around and get recommendations. You will then need to call the office and see if the doctor you have chosen will accept you as a new patient and what types of options they offer for patients who are self-pay. Some doctors offer discounts or will set up payment plans for their patients who do not have insurance, but you will need to ask so you aren't surprised when you get to your appointment. ° °2) Contact Your Local Health Department °Not all health departments have doctors that can see patients for sick visits, but many do, so it is worth a call to see if yours does. If you don't know where your local health department is, you can check in your phone book. The CDC also has a tool to help you locate your state's health department, and many state websites also have listings of all of their local health departments. ° °3) Find a Walk-in Clinic °If your illness is not likely to be very severe or complicated, you may want to try a walk in clinic. These are popping up all over the country in pharmacies, drugstores, and shopping centers. They're usually staffed by nurse practitioners or physician assistants that have been trained to treat common illnesses and complaints. They're usually fairly   quick and inexpensive. However, if you have serious medical issues or chronic medical problems, these are probably not your best option. ° °No Primary Care Doctor: °- Call [facility] at  832-8000 - they can help you locate a primary care doctor that  accepts your insurance, provides certain services, etc. °- Physician Referral Service- 1-800-533-3463 ° °Chronic Pain Problems: °Organization         Address  Phone   Notes  °[facility]  ([phone]) [phone] Patients need to be referred by  their primary care doctor.  ° °Medication Assistance: °Organization         Address  Phone   Notes  °[place] [facility] [address] °[place], [place] [address] [phone]) [phone] --Must be a resident of [place] °-- Must have NO insurance coverage whatsoever (no Medicaid/ Medicare, etc.) °-- The pt. MUST have a primary care doctor that directs their care regularly and follows them in the community °  °[facility]  (866) 331-1348   °[employer]  (888) 892-1162   ° °Agencies that provide inexpensive medical care: °Organization         Address  Phone   Notes  °[facility]  ([phone]) [phone]   °[facility]    (336) 832-7272   °[facility] [address] °[place], [place] [address] (336) 832-4777   °[facility] [address], °[place] (336) [phone]   °[facility]    (336) 373-0678   °[facility]    (336) 272-1050   °[facility] ° [address], [place] Phone:  (336) 832-4444, Fax:  (336) [phone] Hours of Operation:  9 am - 6 pm, M-F.  Also accepts Medicaid/Medicare and self-pay.  °[facility] ° [address], [place] Phone: (336) [phone], Fax: ([phone]) [phone]. Hours of Operation:  8:30 am - 5:30 pm, M-F.  Also accepts Medicaid and self-pay.  °[facility] [place] [address], [place] Phone: ([phone]) [phone]   °[facility] [address], [place], [place] (336)723-1848, Ext. 123 Mondays & Thursdays: 7-9 AM.  First 15 patients are seen on a first come, first serve basis. °  ° °Medicaid-accepting [place] Providers: ° °Organization         Address  Phone   Notes  °[facility] [address], [place] ([phone]) [phone] Also accepts self-pay patients.  °[facility] [address], [place] ° (336) [phone]   °[facility]  [address], [place] ([phone]) [phone]   °[facility] [address] [place] [address], [place] ([phone]) [phone]   °[facility] [address], [place]  ° ([phone]) [phone] Only accepts [place] Access Medicaid patients after they have their name applied to their card.  ° °Self-Pay (no insurance) in [place]: ° °Organization         Address  Phone   Notes  °Sickle Cell Patients, [facility] [address], [place] (336) [phone]   °[facility] [address], [place] (336) [phone]   °[facility] [place] ° [address], [place] (336) [phone]   °[facility]/Dr. Osei-Bonsu ° [address] [place] [address], [place] or [address], [place] [phone]) [phone] Phone number   for both [place] and [place] locations is the same.  °[facility] [address], [place] ([phone]) [phone]   °[facility] [address] [place] [address], [place] or [address] ([phone]) [phone] °(336) [phone]   °[facility] [address], [place] (336) [phone], phone; ([phone]) [phone], fax Sees patients 1st and 3rd Saturday of every month.  Must not qualify for public or private insurance (i.e. Medicaid, Medicare,  [place] [employer] Choice, Veterans' Benefits) • Household income should be no more than 200% of the poverty level •The clinic cannot treat you if you are pregnant or think you are pregnant • Sexually transmitted diseases are not treated at the clinic.  ° ° ° °

## 2016-02-17 NOTE — ED Notes (Signed)
Pt arrives via POv from home with several day hx of nasal congestion, cough, chest pains with coughing, fever.

## 2016-02-17 NOTE — ED Notes (Signed)
Unable to locate when called for room x3 

## 2016-02-17 NOTE — ED Provider Notes (Signed)
CSN: [id number]     Arrival date & time 02/17/16  1133 History   First MD Initiated Contact with Patient 02/17/16 1814     Chief Complaint  Patient presents with  . Cough  . Influenza    HPI  Shawna Rodgers is an 55 y.o. female with history of asthma, chronic bronchitis who presents to the ED for evaluation of cough, congestion, body aches, nausea, and chest pains. She states her symptoms all began four days ago. She reports cough productive of green sputum. Reports associated diffuse severe body aches. She states she has had very poor appetite. Endorses associated nausea but denies abdominal pain, emesis, diarrhea. States her chest hurts from coughing so much. Denies SOB. States she feels fatigued. Denies feeling dizzy or lightheaded. States she has not tried anything to alleviate her symptoms.  Pt is also complaining of a pruritic patchy rash at the lateral aspect of her left ankle near where she has had ankle surgery in the past. She states it has been there "for a long time" and it is very itchy. She has tried OTC lotions with no relief. States it is itchy but does not hurt. Denies drainage or bleeding. Denies swelling.   Past Medical History  Diagnosis Date  . Asthma   . Chronic bronchitis ([place])   . History of blood transfusion 08/07/2013    "first one I've ever had was today" (08/07/2013)  . Anemia    Past Surgical History  Procedure Laterality Date  . Robotic assisted total hysterectomy N/A 05/24/2014    Procedure: ATTEMPTED ROBOTIC ASSISTED TOTAL HYSTERECTOMY ;  Surgeon: Lavonia Drafts, MD;  Location: [facility];  Service: Gynecology;  Laterality: N/A;  wound class contaminated  . Vaginal hysterectomy N/A 05/24/2014    Procedure: HYSTERECTOMY VAGINAL,;  Surgeon: Lavonia Drafts, MD;  Location: [facility];  Service: Gynecology;  Laterality: N/A;  converted to vaginal procedure at 0926  . Cystoscopy N/A 05/24/2014    Procedure: CYSTOSCOPY;  Surgeon: Lavonia Drafts, MD;   Location: [facility];  Service: Gynecology;  Laterality: N/A;  . I&d extremity Left 08/16/2015    Procedure: IRRIGATION AND DEBRIDEMENT LEFT ANKLE AND EXPLORATION;  Surgeon: Leandrew Koyanagi, MD;  Location: [facility];  Service: Orthopedics;  Laterality: Left;  . Tendon repair Left 08/16/2015    Procedure: TENDON REPAIR;  Surgeon: Leandrew Koyanagi, MD;  Location: [facility];  Service: Orthopedics;  Laterality: Left;   Family History  Problem Relation Age of Onset  . Breast cancer Mother   . Diabetes Mellitus II Father    Social History  Substance Use Topics  . Smoking status: Former Smoker -- 0.50 packs/day for 32 years    Types: Cigarettes    Quit date: 02/14/2014  . Smokeless tobacco: Former Systems developer    Quit date: 02/14/2014  . Alcohol Use: Yes     Comment: 08/07/2013 "holidays, birthdays I'll have a mixed drink or glass of wine"   OB History    Gravida Para Term Preterm AB TAB SAB Ectopic Multiple Living   4 3 3  1 1  0 0 0 3     Review of Systems  All other systems reviewed and are negative.     Allergies  Bee venom  Home Medications   Prior to Admission medications   Medication Sig Start Date End Date Taking? Authorizing Provider  aspirin EC 325 MG tablet Take 1 tablet (325 mg total) by mouth 2 (two) times daily. 08/16/15   [place]  Ephriam Jenkins, MD  HYDROcodone-acetaminophen (NORCO) 7.5-325 MG per tablet Take 1-2 tablets by mouth every 6 (six) hours as needed for moderate pain. 08/16/15   [place] Ephriam Jenkins, MD  oxyCODONE-acetaminophen (PERCOCET/ROXICET) 5-325 MG per tablet Take 1 tablet by mouth every 6 (six) hours as needed for severe pain. 08/12/15   Leo Grosser, MD  triamcinolone (KENALOG) 0.025 % ointment Apply 1 application topically 2 (two) times daily. 08/01/15   Delsa Grana, PA-C   BP 147/84 mmHg  Pulse 110  Temp(Src) 102 F (38.9 C) (Oral)  Resp 18  SpO2 94%  LMP 02/12/2014 Physical Exam  Constitutional: She is oriented to person, place, and time.  Appears  unwell   HENT:  Right Ear: External ear normal.  Left Ear: External ear normal.  Nose: Nose normal.  Mouth/Throat: Oropharynx is clear and moist. No oropharyngeal exudate.  Eyes: Conjunctivae and EOM are normal. Pupils are equal, round, and reactive to light.  Neck: Normal range of motion. Neck supple.  Cardiovascular: Regular rhythm, normal heart sounds and intact distal pulses.  Tachycardia present.   Pulmonary/Chest: Effort normal. No respiratory distress. She has no wheezes. She exhibits no tenderness.  Coarse breath sounds with upper airway congestion, bilateral lower lobe rhonchi that clear with coughing  Abdominal: Soft. Bowel sounds are normal. She exhibits no distension. There is no tenderness. There is no rebound and no guarding.  Musculoskeletal: She exhibits no edema.  Neurological: She is alert and oriented to person, place, and time. No cranial nerve deficit.  Skin: Skin is warm and dry.  Lateral left ankle with 4cm patch of scaly, slightly raised rash. Surrounding skin is dry. No erythema. No edema. No purulence or bleeding.   Psychiatric: She has a normal mood and affect.  Nursing note and vitals reviewed.   ED Course  Procedures (including critical care time) Labs Review Labs Reviewed  COMPREHENSIVE METABOLIC PANEL - Abnormal; Notable for the following:    Chloride 97 (*)    Glucose, Bld 173 (*)    BUN <5 (*)    Albumin 3.4 (*)    All other components within normal limits  URINALYSIS, ROUTINE W REFLEX MICROSCOPIC (NOT AT [facility]) - Abnormal; Notable for the following:    Color, Urine AMBER (*)    APPearance CLOUDY (*)    Hgb urine dipstick LARGE (*)    Protein, ur 100 (*)    All other components within normal limits  URINE MICROSCOPIC-ADD ON - Abnormal; Notable for the following:    Squamous Epithelial / LPF 0-5 (*)    Bacteria, UA RARE (*)    All other components within normal limits  I-STAT CG4 LACTIC ACID, ED - Abnormal; Notable for the following:    Lactic  Acid, Venous 2.10 (*)    All other components within normal limits  I-STAT CG4 LACTIC ACID, ED - Abnormal; Notable for the following:    Lactic Acid, Venous 0.34 (*)    All other components within normal limits  CBC  TROPONIN I  I-STAT CG4 LACTIC ACID, ED    Imaging Review Dg Chest 2 View  02/17/2016  CLINICAL DATA:  Flu like symptoms for 3 days, 102 degree temperature today, chest pain, history asthma and chronic bronchitis, former smoker EXAM: CHEST  2 VIEW COMPARISON:  08/07/2013 FINDINGS: Normal heart size, mediastinal contours, and pulmonary vascularity. Minimal chronic peribronchial thickening. Lungs otherwise clear. No pleural effusion or pneumothorax. Bones unremarkable. IMPRESSION: Minimal chronic bronchitic changes without infiltrate. Electronically Signed   By: Elta [place]  Thornton Papas M.D.   On: 02/17/2016 13:25   I have personally reviewed and evaluated these images and lab results as part of my medical decision-making.   EKG Interpretation   Date/Time:  Monday February 17 2016 19:37:14 EDT Ventricular Rate:  95 PR Interval:  148 QRS Duration: 101 QT Interval:  339 QTC Calculation: 426 R Axis:   92 Text Interpretation:  Sinus rhythm Borderline right axis deviation  Borderline T wave abnormalities Borderline ST elevation, lateral leads ED  PHYSICIAN INTERPRETATION AVAILABLE IN [facility] Confirmed by TEST,  Record ([id number]) on 02/18/2016 6:43:35 AM      MDM   Final diagnoses:  Flu-like symptoms  Rash    Suspect flu like illness. Will give fluids, toradol, zofran, tylenol. Will recheck lactic acid once 2L of fluids have completed. If improved, anticipate d/c home with supportive meds. Pt is outside of Tamiflu window. Will have to sign out to oncoming team, PA-C Tiffany greene.     Anne Ng, PA-C 02/18/16 1523  Tanna Furry, MD 02/29/16 2256

## 2016-02-17 NOTE — ED Provider Notes (Signed)
The patient was  originally seen by Delrae Rend, PA-C. Concern for flu and dehydration. She also has an elevated lactic acid. The plan is to recheck her lactate to make sure it has resolved before discharge.  10:00 pm Re-examined the patient. 2nd liter still almost full. The patient states that she is feeling much much better already. Will let at least half liter of fluids run and then recheck lactic acid.  12:03 am The patients lactic acid acid cleared and improved from 2.10 to 0.34. She will be discharged and paperwork already written by Delrae Rend, PA-C  Medications  acetaminophen (TYLENOL) tablet 650 mg (650 mg Oral Given 02/17/16 1923)  sodium chloride 0.9 % bolus 1,000 mL (0 mLs Intravenous Stopped 02/17/16 2015)  ketorolac (TORADOL) 30 MG/ML injection 30 mg (30 mg Intravenous Given 02/17/16 1922)  ondansetron (ZOFRAN) injection 4 mg (4 mg Intravenous Given 02/17/16 1922)  sodium chloride 0.9 % bolus 1,000 mL (0 mLs Intravenous Stopped 02/17/16 2225)     I feel the patient has had an appropriate workup for their chief complaint at this time and likelihood of emergent condition existing is low. Discussed s/sx that warrant return to the ED.  Filed Vitals:   02/17/16 2330 02/17/16 2345  BP: 136/89 112/73  Pulse: 83 87  Temp:    Resp: 15 [id number] PA-C 02/18/16 0004  Tanna Furry, MD 02/29/16 2256

## 2016-02-18 LAB — URINALYSIS, ROUTINE W REFLEX MICROSCOPIC
BILIRUBIN URINE: NEGATIVE
Glucose, UA: NEGATIVE mg/dL
Ketones, ur: NEGATIVE mg/dL
Leukocytes, UA: NEGATIVE
NITRITE: NEGATIVE
PROTEIN: 100 mg/dL — AB
SPECIFIC GRAVITY, URINE: 1.022 (ref 1.005–1.030)
pH: 6 (ref 5.0–8.0)

## 2016-02-18 LAB — URINE MICROSCOPIC-ADD ON

## 2016-02-18 NOTE — ED Notes (Signed)
Pt stable, ambulatory, states understanding of discharge instructions 

## 2016-10-15 ENCOUNTER — Other Ambulatory Visit: Payer: Self-pay | Admitting: Internal Medicine

## 2016-10-15 DIAGNOSIS — E2839 Other primary ovarian failure: Secondary | ICD-10-CM

## 2016-10-20 ENCOUNTER — Encounter: Payer: Self-pay | Admitting: Internal Medicine

## 2016-10-26 IMAGING — DX DG ANKLE COMPLETE 3+V*L*
3 series · 3 of 3 positions shown · non-contrast
Comparison: None.

CLINICAL DATA: 54-year-old female with tendon injury to the
anterior left ankle. Patient presenting with left ankle laceration.

EXAM:
LEFT ANKLE COMPLETE - 3+ VIEW

[ankle ap]
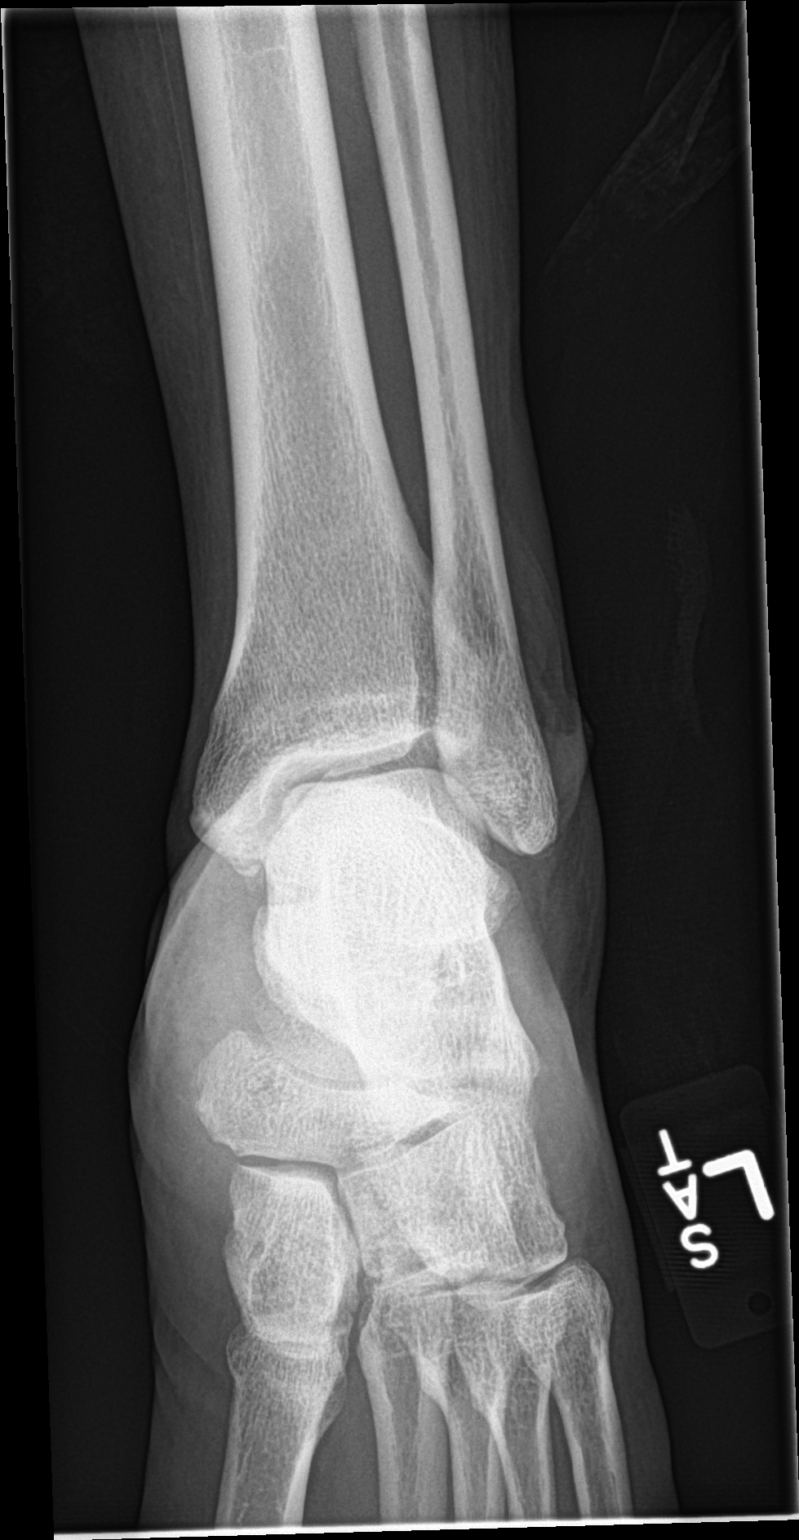

[ankle lat]
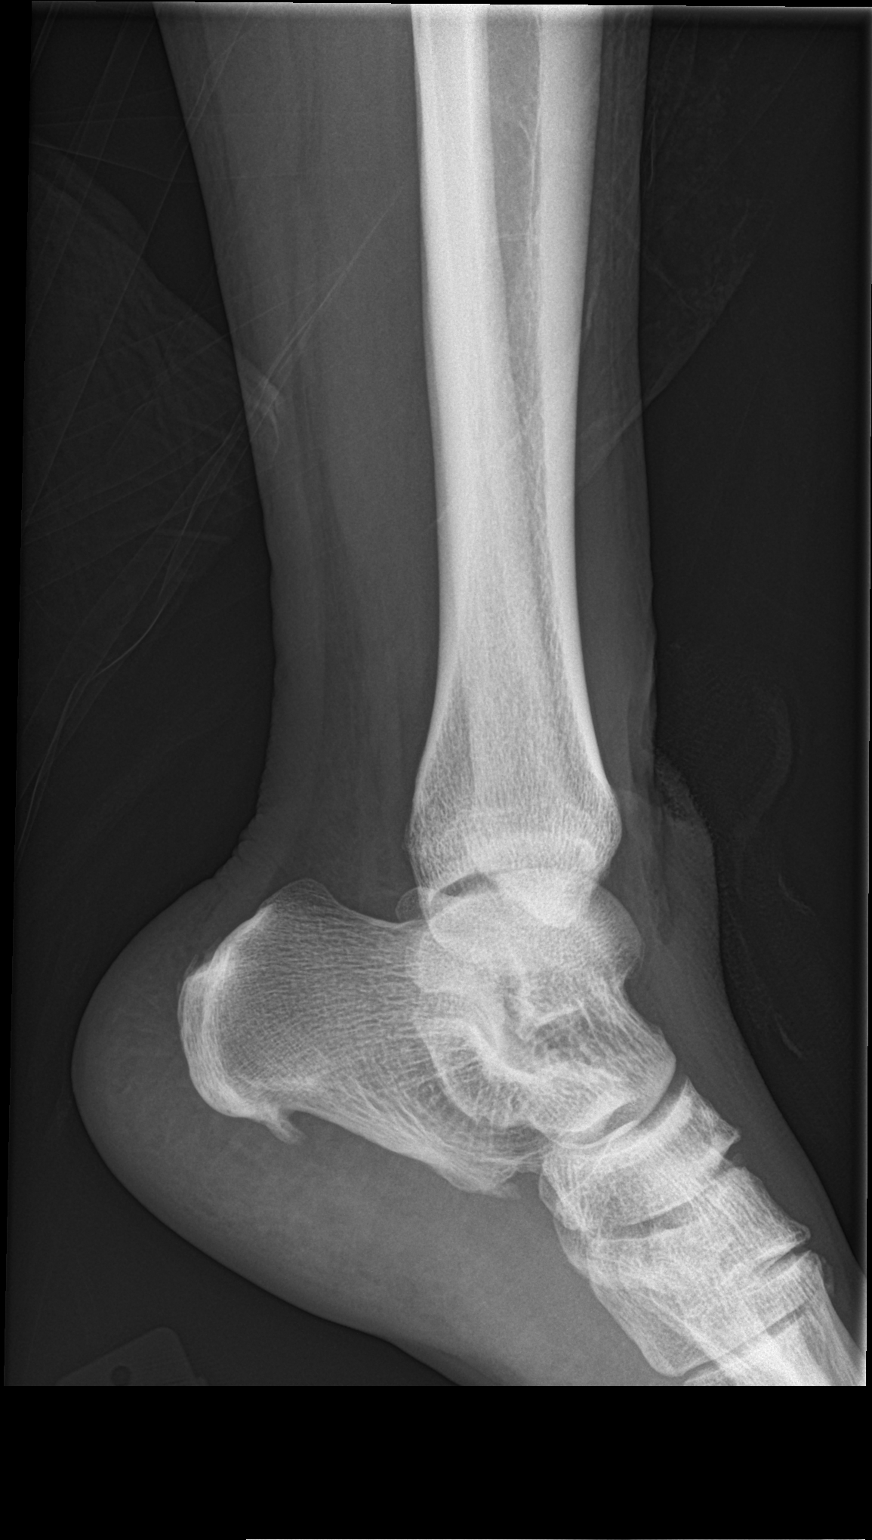

[ankle obl]
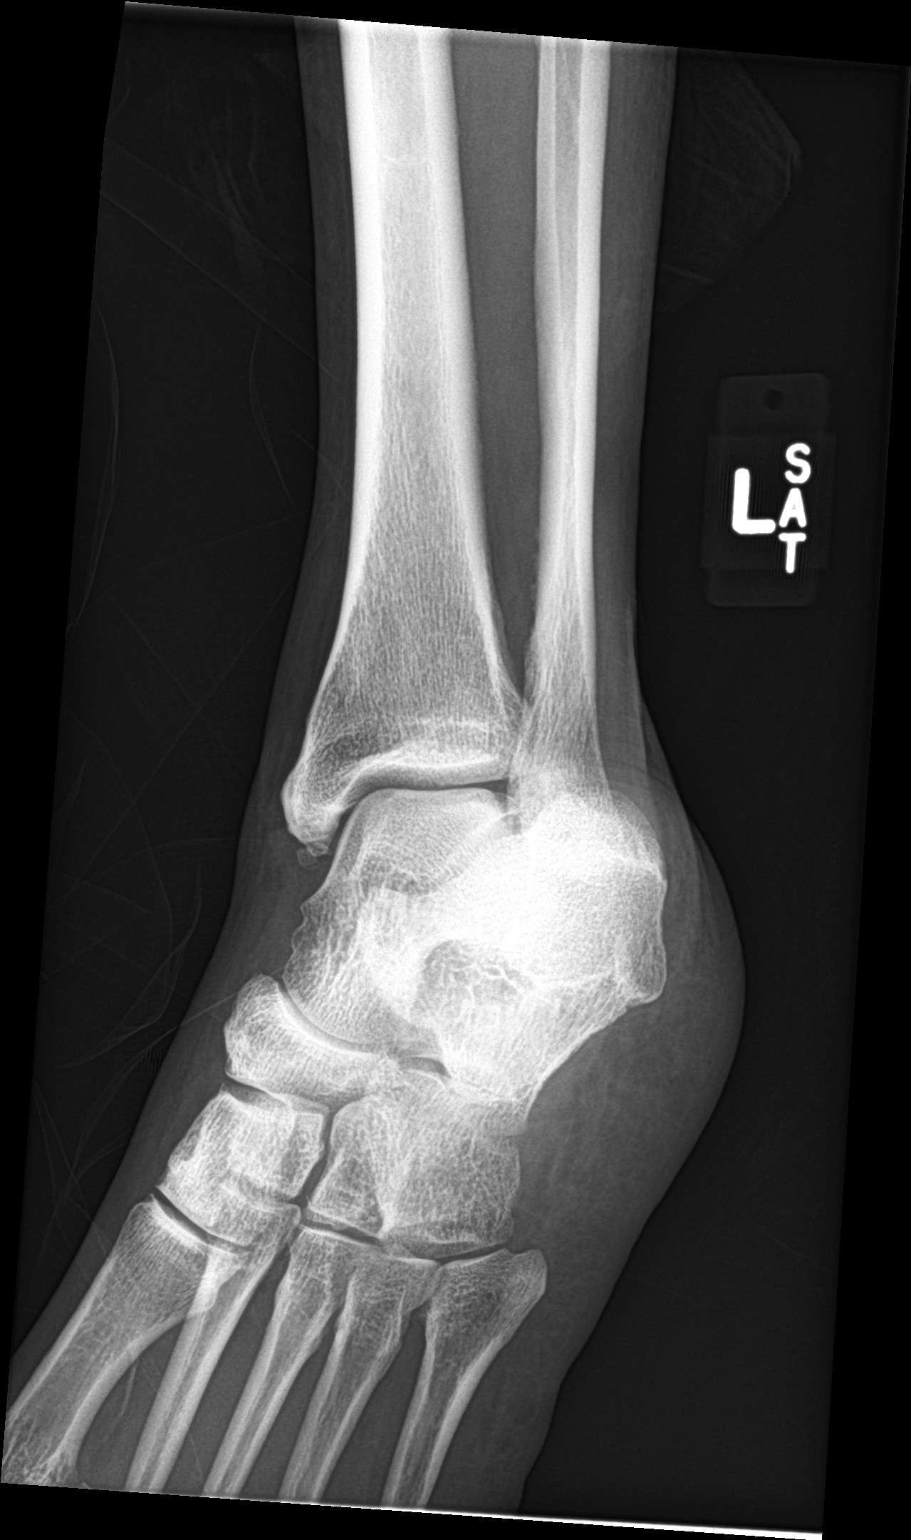

[3 of 3 positions shown; findings below may reference images not displayed]

FINDINGS: There is no fracture or dislocation. There is laceration of the skin
and soft tissues anterior to the ankle. No radiopaque foreign object
identified. A 5 mm calcaneal spur is noted.
IMPRESSION: Laceration of the skin anterior to the ankle. No radiopaque foreign
object. No acute fracture.

## 2017-01-12 ENCOUNTER — Emergency Department (HOSPITAL_COMMUNITY)
Admission: EM | Admit: 2017-01-12 | Discharge: 2017-01-12 | Disposition: A | Discharge status: Home / Self Care | Payer: Medicaid Other | Attending: Emergency Medicine | Admitting: Emergency Medicine

## 2017-01-12 ENCOUNTER — Encounter (HOSPITAL_COMMUNITY): Payer: Self-pay

## 2017-01-12 DIAGNOSIS — R21 Rash and other nonspecific skin eruption: Secondary | ICD-10-CM | POA: Diagnosis present

## 2017-01-12 DIAGNOSIS — L239 Allergic contact dermatitis, unspecified cause: Secondary | ICD-10-CM | POA: Diagnosis not present

## 2017-01-12 DIAGNOSIS — Z7982 Long term (current) use of aspirin: Secondary | ICD-10-CM | POA: Diagnosis not present

## 2017-01-12 DIAGNOSIS — Z87891 Personal history of nicotine dependence: Secondary | ICD-10-CM | POA: Diagnosis not present

## 2017-01-12 DIAGNOSIS — Z79899 Other long term (current) drug therapy: Secondary | ICD-10-CM | POA: Diagnosis not present

## 2017-01-12 DIAGNOSIS — J45909 Unspecified asthma, uncomplicated: Secondary | ICD-10-CM | POA: Insufficient documentation

## 2017-01-12 MED ORDER — DEXAMETHASONE SODIUM PHOSPHATE 10 MG/ML IJ SOLN
10.0000 mg | Freq: Once | INTRAMUSCULAR | Status: AC
  Administered 2017-01-12: 10 mg via INTRAMUSCULAR
  Filled 2017-01-12: qty 1

## 2017-01-12 MED ORDER — HYDROXYZINE HCL 25 MG PO TABS
25.0000 mg | ORAL_TABLET | Freq: Four times a day (QID) | ORAL | 0 refills | Status: DC | PRN
Start: 2017-01-12 — End: 2017-10-26

## 2017-01-12 NOTE — ED Notes (Signed)
Pt states understands instructions. Home stable with steady gait.

## 2017-01-12 NOTE — ED Triage Notes (Signed)
Patient here with fine itchy rash to body since Friday after spraying new cologne on self. No distress, using benadryl with minimal relief

## 2017-01-12 NOTE — Discharge Instructions (Signed)
Read instructions below to learn more about your diagnosis and for reasons to return to the ED. Follow up with your doctor if symptoms are not improving by Friday. You may return to the emergency department if symptoms worsen, become progressive, or become more concerning.  Allergic Reaction  There are many allergens around [place]. It may be difficult to know what caused your reaction. You may follow up with an allergy specialist for further testing to learn more about your specific allergies.   See Your Primary Care Doctor if:  Your allergies are becoming progressively more troublesome.  You suspect a food allergy. Symptoms generally happen within 30 minutes of eating a food.  Your symptoms have not gone away within 3 days.  SEEK IMMEDIATE MEDICAL CARE IF:  You develop difficulty breathing or wheezing, or have a tight feeling in your chest or throat (feeling like your throat is closing) You develop swollen lips or tongue You develop hives on your chest, neck or face. You are unable to swallow fluids or salvia secretions.   A severe reaction with any of the above problems should be considered life-threatening. If you suddenly develop difficulty breathing call for local emergency medical help. THIS IS AN EMERGENCY.

## 2017-01-12 NOTE — ED Provider Notes (Signed)
[facility] DEPT Provider Note   CSN: [id number] Arrival date & time: 01/12/17  1321   By signing my name below, I, Shawna Rodgers, attest that this documentation has been prepared under the direction and in the presence of [facility], PA-C. Electronically Signed: Evelene Rodgers, Scribe. 01/12/2017. 4:19 PM.  History   Chief Complaint Chief Complaint  Patient presents with  . Rash     The history is provided by the patient. No language interpreter was used.   HPI Comments:  Shawna Rodgers is a 56 y.o. female who presents to the Emergency Department complaining of a diffuse pruritic rash x 5 days. She states the itching began the day before the rash appeared shortly after using a new perfume. She has used benadryl, calamine lotion, and black soap without relief.  She reports mild associated swelling to her face. Denies swelling in her throat and difficulty swallowing. No shortness of breath. Pt has a history of asthma; no recent steroid use. She states that she has an inhaler at home.  Past Medical History:  Diagnosis Date  . Anemia   . Asthma   . Chronic bronchitis ([place])   . History of blood transfusion 08/07/2013   "first one I've ever had was today" (08/07/2013)    Patient Active Problem List   Diagnosis Date Noted  . Post-operative state 05/24/2014  . Fibroids 02/09/2014  . Menorrhagia 02/09/2014  . UTI (lower urinary tract infection) 08/07/2013  . Anemia 08/07/2013    Past Surgical History:  Procedure Laterality Date  . CYSTOSCOPY N/A 05/24/2014   Procedure: CYSTOSCOPY;  Surgeon: Lavonia Drafts, MD;  Location: [facility];  Service: Gynecology;  Laterality: N/A;  . I&D EXTREMITY Left 08/16/2015   Procedure: IRRIGATION AND DEBRIDEMENT LEFT ANKLE AND EXPLORATION;  Surgeon: Leandrew Koyanagi, MD;  Location: [place] [facility];  Service: Orthopedics;  Laterality: Left;  . ROBOTIC ASSISTED TOTAL HYSTERECTOMY N/A 05/24/2014   Procedure: ATTEMPTED ROBOTIC ASSISTED TOTAL  HYSTERECTOMY ;  Surgeon: Lavonia Drafts, MD;  Location: [facility];  Service: Gynecology;  Laterality: N/A;  wound class contaminated  . TENDON REPAIR Left 08/16/2015   Procedure: TENDON REPAIR;  Surgeon: Leandrew Koyanagi, MD;  Location: [place];  Service: Orthopedics;  Laterality: Left;  Marland Kitchen VAGINAL HYSTERECTOMY N/A 05/24/2014   Procedure: HYSTERECTOMY VAGINAL,;  Surgeon: Lavonia Drafts, MD;  Location: [facility];  Service: Gynecology;  Laterality: N/A;  converted to vaginal procedure at 0926    OB History    Gravida Para Term Preterm AB Living   4 3 3   1 3    SAB TAB Ectopic Multiple Live Births   0 1 0 0         Home Medications    Prior to Admission medications   Medication Sig Start Date End Date Taking? Authorizing Provider  albuterol (PROVENTIL HFA;VENTOLIN HFA) 108 (90 Base) MCG/ACT inhaler Inhale 1-2 puffs into the lungs every 6 (six) hours as needed for wheezing or shortness of breath. 02/17/16   Anne Ng, PA-C  aspirin EC 325 MG tablet Take 1 tablet (325 mg total) by mouth 2 (two) times daily. Patient not taking: Reported on 02/17/2016 08/16/15   Leandrew Koyanagi, MD  benzonatate (TESSALON) 100 MG capsule Take 1 capsule (100 mg total) by mouth every 8 (eight) hours. 02/17/16   Olivia Canter Sam, PA-C  guaiFENesin (MUCINEX) 600 MG 12 hr tablet Take 1 tablet (600 mg total) by mouth 2 (two) times daily as needed. 02/17/16  Olivia Canter Sam, PA-C  HYDROcodone-acetaminophen (NORCO) 7.5-325 MG per tablet Take 1-2 tablets by mouth every 6 (six) hours as needed for moderate pain. Patient not taking: Reported on 02/17/2016 08/16/15   Leandrew Koyanagi, MD  hydrOXYzine (ATARAX/VISTARIL) 25 MG tablet Take 1 tablet (25 mg total) by mouth every 6 (six) hours as needed for itching. 01/12/17   Ozella Almond Hayven Fatima, PA-C  ibuprofen (ADVIL,MOTRIN) 800 MG tablet Take 1 tablet (800 mg total) by mouth 3 (three) times daily. 02/17/16   Anne Ng, PA-C  oxyCODONE-acetaminophen (PERCOCET/ROXICET) 5-325 MG  per tablet Take 1 tablet by mouth every 6 (six) hours as needed for severe pain. Patient not taking: Reported on 02/17/2016 08/12/15   Leo Grosser, MD  triamcinolone (KENALOG) 0.025 % ointment Apply 1 application topically 2 (two) times daily. Patient not taking: Reported on 02/17/2016 08/01/15   Delsa Grana, PA-C  triamcinolone lotion (KENALOG) 0.1 % Apply 1 application topically 3 (three) times daily. 02/17/16   Anne Ng, PA-C    Family History Family History  Problem Relation Age of Onset  . Breast cancer Mother   . Diabetes Mellitus II Father     Social History Social History  Substance Use Topics  . Smoking status: Former Smoker    Packs/day: 0.50    Years: 32.00    Types: Cigarettes    Quit date: 02/14/2014  . Smokeless tobacco: Former Systems developer    Quit date: 02/14/2014  . Alcohol use Yes     Comment: 08/07/2013 "holidays, birthdays I'll have a mixed drink or glass of wine"     Allergies   Bee venom   Review of Systems Review of Systems  HENT: Positive for facial swelling. Negative for trouble swallowing.   Respiratory: Negative for shortness of breath.   Skin: Positive for rash.     Physical Exam Updated Vital Signs BP 122/70 (BP Location: Right Arm)   Pulse 82   Temp 98.4 F (36.9 C) (Oral)   Resp 18   LMP 02/12/2014   SpO2 100%   Physical Exam  Constitutional: She is oriented to person, place, and time. She appears well-developed and well-nourished. No distress.  HENT:  Head: Normocephalic and atraumatic.  Airway patent   No oral swelling   Cardiovascular: Normal rate, regular rhythm and normal heart sounds.   No murmur heard. Pulmonary/Chest: Effort normal. No respiratory distress. She has wheezes. She has no rales.  Abdominal: Soft. She exhibits no distension. There is no tenderness.  Musculoskeletal: She exhibits no edema.  Neurological: She is alert and oriented to person, place, and time.  Skin: Skin is warm and dry. Rash noted.  Diffuse fine  prutitic rash to torso and upper extremities  Nursing note and vitals reviewed.   ED Treatments / Results  DIAGNOSTIC STUDIES:  Oxygen Saturation is 98% on RA, normal by my interpretation.    COORDINATION OF CARE:  4:16 PM Discussed treatment plan with pt at bedside and pt agreed to plan.  Labs (all labs ordered are listed, but only abnormal results are displayed) Labs Reviewed - No data to display  EKG  EKG Interpretation None       Radiology No results found.  Procedures Procedures (including critical care time)  Medications Ordered in ED Medications  dexamethasone (DECADRON) injection 10 mg (10 mg Intramuscular Given 01/12/17 1642)     Initial Impression / Assessment and Plan / ED Course  I have reviewed the triage vital signs and the nursing notes.  Pertinent  labs & imaging results that were available during my care of the patient were reviewed by me and considered in my medical decision making (see chart for details).     ANABIA DOVALINA is a 56 y.o. female who presents to ED for rash c/w contact dermatitis. No oral swelling or difficulty breathing. Instructed to avoid offending agent and to use unscented soaps, lotions, and detergents. Will treat with decadron and atarax.  No signs of secondary infection. Follow up with PCP in 2-3 days. Return precautions discussed. Pt is safe for discharge at this time.   Final Clinical Impressions(s) / ED Diagnoses   Final diagnoses:  Allergic contact dermatitis, unspecified trigger    New Prescriptions Discharge Medication List as of 01/12/2017  4:22 PM    START taking these medications   Details  hydrOXYzine (ATARAX/VISTARIL) 25 MG tablet Take 1 tablet (25 mg total) by mouth every 6 (six) hours as needed for itching., Starting Tue 01/12/2017, Print       I personally performed the services described in this documentation, which was scribed in my presence. The recorded information has been reviewed and is accurate.      [facility] Jamilia Jacques, PA-C 01/12/17 1733    Dorie Rank, MD 01/12/17 Despina Pole

## 2017-04-16 ENCOUNTER — Other Ambulatory Visit: Payer: Self-pay | Admitting: Internal Medicine

## 2017-04-16 DIAGNOSIS — E2839 Other primary ovarian failure: Secondary | ICD-10-CM

## 2017-04-21 ENCOUNTER — Encounter: Payer: Self-pay | Admitting: Internal Medicine

## 2017-04-22 ENCOUNTER — Other Ambulatory Visit: Payer: No Typology Code available for payment source

## 2017-04-28 ENCOUNTER — Ambulatory Visit
Admission: RE | Admit: 2017-04-28 | Discharge: 2017-04-28 | Disposition: A | Discharge status: Home / Self Care | Payer: Medicaid Other | Source: Ambulatory Visit | Attending: Internal Medicine | Admitting: Internal Medicine

## 2017-04-28 ENCOUNTER — Other Ambulatory Visit: Payer: Self-pay | Admitting: Internal Medicine

## 2017-04-28 DIAGNOSIS — Z1231 Encounter for screening mammogram for malignant neoplasm of breast: Secondary | ICD-10-CM

## 2017-04-28 DIAGNOSIS — E2839 Other primary ovarian failure: Secondary | ICD-10-CM

## 2017-05-11 ENCOUNTER — Ambulatory Visit: Payer: Medicaid Other

## 2017-05-25 ENCOUNTER — Ambulatory Visit: Payer: Medicaid Other

## 2017-06-04 ENCOUNTER — Telehealth: Payer: Self-pay

## 2017-06-04 NOTE — Telephone Encounter (Signed)
Patient No Showed for Pre-Visit appointment that was scheduled 06/04/17 @ 2:30. Patient was called to reschedule. Left message stating that we would cancel her colonoscopy that is scheduled on 06/21/17 if she did not call back to reschedule appointment by 5:00 Pm today. If I don't hear back from her today, I will send her a no show letter.    Riki Sheer, LPN

## 2017-06-07 ENCOUNTER — Encounter: Payer: Self-pay | Admitting: Internal Medicine

## 2017-06-21 ENCOUNTER — Encounter: Payer: No Typology Code available for payment source | Admitting: Internal Medicine

## 2017-07-14 ENCOUNTER — Ambulatory Visit
Admission: RE | Admit: 2017-07-14 | Discharge: 2017-07-14 | Disposition: A | Discharge status: Home / Self Care | Payer: Medicaid Other | Source: Ambulatory Visit | Attending: Internal Medicine | Admitting: Internal Medicine

## 2017-07-14 DIAGNOSIS — Z1231 Encounter for screening mammogram for malignant neoplasm of breast: Secondary | ICD-10-CM

## 2017-07-19 ENCOUNTER — Other Ambulatory Visit: Payer: Self-pay | Admitting: Internal Medicine

## 2017-07-19 DIAGNOSIS — R928 Other abnormal and inconclusive findings on diagnostic imaging of breast: Secondary | ICD-10-CM

## 2017-07-23 ENCOUNTER — Ambulatory Visit
Admission: RE | Admit: 2017-07-23 | Discharge: 2017-07-23 | Disposition: A | Discharge status: Home / Self Care | Payer: Medicaid Other | Source: Ambulatory Visit | Attending: Internal Medicine | Admitting: Internal Medicine

## 2017-07-23 DIAGNOSIS — R928 Other abnormal and inconclusive findings on diagnostic imaging of breast: Secondary | ICD-10-CM

## 2017-08-03 ENCOUNTER — Encounter (HOSPITAL_COMMUNITY): Payer: Self-pay

## 2017-08-03 ENCOUNTER — Emergency Department (HOSPITAL_COMMUNITY)
Admission: EM | Admit: 2017-08-03 | Discharge: 2017-08-03 | Disposition: A | Discharge status: Home / Self Care | Payer: Medicaid Other | Attending: Emergency Medicine | Admitting: Emergency Medicine

## 2017-08-03 DIAGNOSIS — R102 Pelvic and perineal pain: Secondary | ICD-10-CM

## 2017-08-03 DIAGNOSIS — J45909 Unspecified asthma, uncomplicated: Secondary | ICD-10-CM | POA: Diagnosis not present

## 2017-08-03 DIAGNOSIS — N390 Urinary tract infection, site not specified: Secondary | ICD-10-CM | POA: Insufficient documentation

## 2017-08-03 DIAGNOSIS — Z87891 Personal history of nicotine dependence: Secondary | ICD-10-CM | POA: Diagnosis not present

## 2017-08-03 DIAGNOSIS — N3000 Acute cystitis without hematuria: Secondary | ICD-10-CM

## 2017-08-03 DIAGNOSIS — R1030 Lower abdominal pain, unspecified: Secondary | ICD-10-CM | POA: Diagnosis present

## 2017-08-03 LAB — LIPASE, BLOOD: Lipase: 22 U/L (ref 11–51)

## 2017-08-03 LAB — CBC
HCT: 40.5 % (ref 36.0–46.0)
Hemoglobin: 12.9 g/dL (ref 12.0–15.0)
MCH: 30.2 pg (ref 26.0–34.0)
MCHC: 31.9 g/dL (ref 30.0–36.0)
MCV: 94.8 fL (ref 78.0–100.0)
PLATELETS: 310 10*3/uL (ref 150–400)
RBC: 4.27 MIL/uL (ref 3.87–5.11)
RDW: 12.9 % (ref 11.5–15.5)
WBC: 12.9 10*3/uL — AB (ref 4.0–10.5)

## 2017-08-03 LAB — COMPREHENSIVE METABOLIC PANEL
ALK PHOS: 60 U/L (ref 38–126)
ALT: 17 U/L (ref 14–54)
AST: 16 U/L (ref 15–41)
Albumin: 3.5 g/dL (ref 3.5–5.0)
Anion gap: 6 (ref 5–15)
BILIRUBIN TOTAL: 0.6 mg/dL (ref 0.3–1.2)
BUN: 7 mg/dL (ref 6–20)
CALCIUM: 9.8 mg/dL (ref 8.9–10.3)
CO2: 30 mmol/L (ref 22–32)
CREATININE: 0.98 mg/dL (ref 0.44–1.00)
Chloride: 103 mmol/L (ref 101–111)
GFR calc non Af Amer: >60 mL/min (ref >60)
Glucose, Bld: 103 mg/dL — ABNORMAL HIGH (ref 65–99)
Potassium: 4.2 mmol/L (ref 3.5–5.1)
Sodium: 139 mmol/L (ref 135–145)
Total Protein: 7.5 g/dL (ref 6.5–8.1)

## 2017-08-03 LAB — URINALYSIS, ROUTINE W REFLEX MICROSCOPIC
Bilirubin Urine: NEGATIVE
GLUCOSE, UA: NEGATIVE mg/dL
KETONES UR: NEGATIVE mg/dL
Nitrite: NEGATIVE
PH: 6 (ref 5.0–8.0)
Protein, ur: 100 mg/dL — AB
Specific Gravity, Urine: 1.018 (ref 1.005–1.030)

## 2017-08-03 MED ORDER — CEPHALEXIN 500 MG PO CAPS: 500.0000 mg | ORAL_CAPSULE | Freq: Two times a day (BID) | ORAL | 0 refills | Status: AC

## 2017-08-03 MED ORDER — SULFAMETHOXAZOLE-TRIMETHOPRIM 800-160 MG PO TABS
1.0000 | ORAL_TABLET | Freq: Once | ORAL | Status: AC
  Administered 2017-08-03: 1 via ORAL
  Filled 2017-08-03: qty 1

## 2017-08-03 MED ORDER — PHENAZOPYRIDINE HCL 100 MG PO TABS
95.0000 mg | ORAL_TABLET | Freq: Once | ORAL | Status: AC
  Administered 2017-08-03: 100 mg via ORAL
  Filled 2017-08-03: qty 1

## 2017-08-03 MED ORDER — PHENAZOPYRIDINE HCL 200 MG PO TABS: 200.0000 mg | ORAL_TABLET | Freq: Three times a day (TID) | ORAL | 0 refills | Status: DC

## 2017-08-03 NOTE — ED Provider Notes (Signed)
[facility] DEPT Provider Note   CSN: [id number] Arrival date & time: 08/03/17  1000     History   Chief Complaint No chief complaint on file.   HPI Shawna Rodgers is a 56 y.o. female.  HPI  Patient presents with concern of lower abdominal pain. Pain began about 3 days ago and since onset has been persistent. The severity is waxing, waning, without clear precipitant. The pain is focally in the lower abdomen, with occasional radiation bilaterally, but is generally suprapubic. No upper abdominal pain, no chest pain, no dyspnea, no fever, no chills. Patient has a history of hysterectomy, but states that in general she is healthy, with no other chronic medical problems.   Past Medical History:  Diagnosis Date  . Anemia   . Asthma   . Chronic bronchitis ([place])   . History of blood transfusion 08/07/2013   "first one I've ever had was today" (08/07/2013)    Patient Active Problem List   Diagnosis Date Noted  . Post-operative state 05/24/2014  . Fibroids 02/09/2014  . Menorrhagia 02/09/2014  . UTI (lower urinary tract infection) 08/07/2013  . Anemia 08/07/2013    Past Surgical History:  Procedure Laterality Date  . CYSTOSCOPY N/A 05/24/2014   Procedure: CYSTOSCOPY;  Surgeon: Lavonia Drafts, MD;  Location: [facility];  Service: Gynecology;  Laterality: N/A;  . I&D EXTREMITY Left 08/16/2015   Procedure: IRRIGATION AND DEBRIDEMENT LEFT ANKLE AND EXPLORATION;  Surgeon: Leandrew Koyanagi, MD;  Location: [facility];  Service: Orthopedics;  Laterality: Left;  . ROBOTIC ASSISTED TOTAL HYSTERECTOMY N/A 05/24/2014   Procedure: ATTEMPTED ROBOTIC ASSISTED TOTAL HYSTERECTOMY ;  Surgeon: Lavonia Drafts, MD;  Location: [facility];  Service: Gynecology;  Laterality: N/A;  wound class contaminated  . TENDON REPAIR Left 08/16/2015   Procedure: TENDON REPAIR;  Surgeon: Leandrew Koyanagi, MD;  Location: [facility];  Service: Orthopedics;  Laterality: Left;  Marland Kitchen VAGINAL  HYSTERECTOMY N/A 05/24/2014   Procedure: HYSTERECTOMY VAGINAL,;  Surgeon: Lavonia Drafts, MD;  Location: [facility];  Service: Gynecology;  Laterality: N/A;  converted to vaginal procedure at 0926    OB History    Gravida Para Term Preterm AB Living   4 3 3   1 3    SAB TAB Ectopic Multiple Live Births   0 1 0 0         Home Medications    Prior to Admission medications   Medication Sig Start Date End Date Taking? Authorizing Provider  albuterol (PROVENTIL HFA;VENTOLIN HFA) 108 (90 Base) MCG/ACT inhaler Inhale 1-2 puffs into the lungs every 6 (six) hours as needed for wheezing or shortness of breath. 02/17/16  Yes Sam, Serena Y, PA-C  hydrOXYzine (ATARAX/VISTARIL) 25 MG tablet Take 1 tablet (25 mg total) by mouth every 6 (six) hours as needed for itching. 01/12/17  Yes Ward, Ozella Almond, PA-C  ibuprofen (ADVIL,MOTRIN) 800 MG tablet Take 1 tablet (800 mg total) by mouth 3 (three) times daily. Patient taking differently: Take 800 mg by mouth every 6 (six) hours as needed for mild pain.  02/17/16  Yes Sam, Serena Y, PA-C  aspirin EC 325 MG tablet Take 1 tablet (325 mg total) by mouth 2 (two) times daily. Patient not taking: Reported on 02/17/2016 08/16/15   Leandrew Koyanagi, MD  benzonatate (TESSALON) 100 MG capsule Take 1 capsule (100 mg total) by mouth every 8 (eight) hours. Patient not taking: Reported on 08/03/2017 02/17/16   Anne Ng, PA-C  guaiFENesin [facility])  600 MG 12 hr tablet Take 1 tablet (600 mg total) by mouth 2 (two) times daily as needed. Patient not taking: Reported on 08/03/2017 02/17/16   Anne Ng, PA-C  HYDROcodone-acetaminophen [facility]) 7.5-325 MG per tablet Take 1-2 tablets by mouth every 6 (six) hours as needed for moderate pain. Patient not taking: Reported on 02/17/2016 08/16/15   Leandrew Koyanagi, MD  oxyCODONE-acetaminophen (PERCOCET/ROXICET) 5-325 MG per tablet Take 1 tablet by mouth every 6 (six) hours as needed for severe pain. Patient not taking: Reported on  02/17/2016 08/12/15   Leo Grosser, MD  triamcinolone (KENALOG) 0.025 % ointment Apply 1 application topically 2 (two) times daily. Patient not taking: Reported on 02/17/2016 08/01/15   Delsa Grana, PA-C  triamcinolone lotion (KENALOG) 0.1 % Apply 1 application topically 3 (three) times daily. Patient not taking: Reported on 08/03/2017 02/17/16   Anne Ng, PA-C    Family History Family History  Problem Relation Age of Onset  . Breast cancer Mother   . Diabetes Mellitus II Father     Social History Social History  Substance Use Topics  . Smoking status: Former Smoker    Packs/day: 0.50    Years: 32.00    Types: Cigarettes    Quit date: 02/14/2014  . Smokeless tobacco: Former Systems developer    Quit date: 02/14/2014  . Alcohol use Yes     Comment: 08/07/2013 "holidays, birthdays I'll have a mixed drink or glass of wine"     Allergies   Bee venom   Review of Systems Review of Systems  Constitutional:       Per HPI, otherwise negative  HENT:       Per HPI, otherwise negative  Respiratory:       Per HPI, otherwise negative  Cardiovascular:       Per HPI, otherwise negative  Gastrointestinal: Positive for abdominal pain. Negative for vomiting.  Endocrine:       Negative aside from HPI  Genitourinary:       Neg aside from HPI   Musculoskeletal:       Per HPI, otherwise negative  Skin: Negative.   Neurological: Negative for syncope.     Physical Exam Updated Vital Signs BP (!) 132/99   Pulse 88   Temp 99.4 F (37.4 C) (Oral)   Resp 18   LMP 02/12/2014   SpO2 100%   Physical Exam  Constitutional: She is oriented to person, place, and time. She appears well-developed and well-nourished. No distress.  HENT:  Head: Normocephalic and atraumatic.  Eyes: Conjunctivae and EOM are normal.  Cardiovascular: Normal rate and regular rhythm.   Pulmonary/Chest: Effort normal and breath sounds normal. No stridor. No respiratory distress.  Abdominal: She exhibits no distension.    Musculoskeletal: She exhibits no edema.  Neurological: She is alert and oriented to person, place, and time. No cranial nerve deficit.  Skin: Skin is warm and dry.  Psychiatric: She has a normal mood and affect.  Nursing note and vitals reviewed.    ED Treatments / Results  Labs (all labs ordered are listed, but only abnormal results are displayed) Labs Reviewed  COMPREHENSIVE METABOLIC PANEL - Abnormal; Notable for the following:       Result Value   Glucose, Bld 103 (*)    All other components within normal limits  CBC - Abnormal; Notable for the following:    WBC 12.9 (*)    All other components within normal limits  URINALYSIS, ROUTINE W REFLEX MICROSCOPIC -  Abnormal; Notable for the following:    APPearance CLOUDY (*)    Hgb urine dipstick LARGE (*)    Protein, ur 100 (*)    Leukocytes, UA LARGE (*)    Bacteria, UA RARE (*)    Squamous Epithelial / LPF 6-30 (*)    All other components within normal limits  LIPASE, BLOOD    Procedures Procedures (including critical care time)  Medications Ordered in ED Medications  sulfamethoxazole-trimethoprim (BACTRIM DS,SEPTRA DS) 800-160 MG per tablet 1 tablet (not administered)     Initial Impression / Assessment and Plan / ED Course  I have reviewed the triage vital signs and the nursing notes.  Pertinent labs & imaging results that were available during my care of the patient were reviewed by me and considered in my medical decision making (see chart for details).  Well-appearing female presents with ongoing suprapubic pain. Here she is awake, alert, in no distress. Patient has no other abdominal pain, has no evidence for peritonitis, no evidence for bacteremia or sepsis. Findings consistent with urinary tract infection.   Final Clinical Impressions(s) / ED Diagnoses  Suprapubic pain Urinary tract infection   Carmin Muskrat, MD 08/03/17 [phone]

## 2017-08-03 NOTE — ED Notes (Signed)
Pt stratsa she understands instructions. Home stable with steady gait.

## 2017-08-03 NOTE — ED Triage Notes (Signed)
Patient complains of lower abdominal pain for 1 week with nausea and loose stools. Describes the pain as intermittent. No vomiting.

## 2017-08-03 NOTE — Discharge Instructions (Signed)
As discussed, your evaluation today has been largely reassuring.  But, it is important that you monitor your condition carefully, and do not hesitate to return to the ED if you develop new, or concerning changes in your condition. ? ?Otherwise, please follow-up with your physician for appropriate ongoing care. ? ?

## 2017-10-26 ENCOUNTER — Ambulatory Visit (AMBULATORY_SURGERY_CENTER): Payer: Self-pay

## 2017-10-26 VITALS — Ht 65.0 in | Wt 185.0 lb

## 2017-10-26 DIAGNOSIS — Z1211 Encounter for screening for malignant neoplasm of colon: Secondary | ICD-10-CM

## 2017-10-26 MED ORDER — SUPREP BOWEL PREP KIT 17.5-3.13-1.6 GM/177ML PO SOLN: 1.0000 | Freq: Once | ORAL | 0 refills | Status: AC

## 2017-10-26 NOTE — Progress Notes (Signed)
No allergies to eggs or soy No diet meds No home oxygen No past problems with anesthesia  Declined emmi 

## 2017-11-08 ENCOUNTER — Telehealth: Payer: Self-pay | Admitting: Internal Medicine

## 2017-11-08 MED ORDER — NA SULFATE-K SULFATE-MG SULF 17.5-3.13-1.6 GM/177ML PO SOLN: 1.0000 | Freq: Once | ORAL | 0 refills | Status: AC

## 2017-11-08 NOTE — Telephone Encounter (Signed)
Patient canceled procedure for tomorrow 12.4.18 due to not getting her prep in time. Patient states their was a problem with ins and pharmacy never called her back. Patient reschedule to 12.27.18

## 2017-11-08 NOTE — Telephone Encounter (Signed)
Pt states was told prep 105$ with [place] Medicaid- called [place] [facility] was very blunt saying prep NOT covered. Called [facility] [address] and they said prep 3$ with [place] [employer]- prep [facility]- pt to go pick up asap in case there is another issue so she can call us before 5 pm today   Shawna Rodgers PV

## 2017-11-08 NOTE — Telephone Encounter (Signed)
OK no charge ?

## 2017-11-08 NOTE — Telephone Encounter (Signed)
Called number left by pt- female informed she is not available to come to the phone, left message to have her return call to PV before 5 pm today   Lelan Pons PV

## 2017-11-09 ENCOUNTER — Encounter: Payer: Self-pay | Admitting: Internal Medicine

## 2017-12-02 ENCOUNTER — Telehealth: Payer: Self-pay | Admitting: Internal Medicine

## 2017-12-02 ENCOUNTER — Encounter: Payer: Medicaid Other | Admitting: Internal Medicine

## 2017-12-02 ENCOUNTER — Telehealth: Payer: Self-pay

## 2017-12-02 NOTE — Telephone Encounter (Signed)
No charge  If she wants a sample for prep we can do that

## 2017-12-02 NOTE — Telephone Encounter (Signed)
Patient cancelled colonoscopy for today at 9am states she was unable to get bowel prep in time. Please advise as to rescheduling.

## 2017-12-02 NOTE — Telephone Encounter (Signed)
-----   Message from Elmer Ramp sent at 12/02/2017  9:18 AM EST ----- Good Morning [name],  This patient has cancelled her colonoscopy late twice due to not being able to afford her prep. I have rescheduled her to 02/01/18 for a colonoscopy with Dr.Gessner and previsit again on 01/18/18. I was hoping maybe you can put her on a list to see if we can give her a sample for her colonoscopy on the 02/01/18.  Thank you, Lorriane Shire

## 2017-12-02 NOTE — Telephone Encounter (Signed)
Put instructions in previsit appointment notes for patient to be given Miralax instructions which will help with cost and is already Dr. Celesta Aver preferred prep.

## 2017-12-03 NOTE — Telephone Encounter (Signed)
Patient has been rescheduled for a colonoscopy and previsit for 02/01/18. Message has also been sent to [facility] to put  patient on a list for prep sample.

## 2018-01-18 ENCOUNTER — Telehealth: Payer: Self-pay

## 2018-01-18 NOTE — Telephone Encounter (Signed)
Patient No Showed for Pre-Visit. Left message for patient to reschedule before 5:00 Pm today. If Patient does not reschedule I will cancel her colonoscopy that is scheduled for 02/01/18 per [place] guidelines. A no show letter will be mailed if she does not reschedule.   Riki Sheer, LPN

## 2018-02-01 ENCOUNTER — Encounter: Payer: Medicaid Other | Admitting: Internal Medicine

## 2019-03-21 ENCOUNTER — Other Ambulatory Visit: Payer: Self-pay | Admitting: Internal Medicine

## 2019-03-21 DIAGNOSIS — E2839 Other primary ovarian failure: Secondary | ICD-10-CM

## 2019-03-29 ENCOUNTER — Other Ambulatory Visit: Payer: Self-pay | Admitting: Internal Medicine

## 2019-06-04 ENCOUNTER — Emergency Department (HOSPITAL_COMMUNITY)
Admission: EM | Admit: 2019-06-04 | Discharge: 2019-06-05 | Disposition: A | Discharge status: Home / Self Care | Payer: Medicaid Other | Attending: Emergency Medicine | Admitting: Emergency Medicine

## 2019-06-04 ENCOUNTER — Encounter (HOSPITAL_COMMUNITY): Payer: Self-pay

## 2019-06-04 ENCOUNTER — Emergency Department (HOSPITAL_COMMUNITY): Payer: Medicaid Other

## 2019-06-04 ENCOUNTER — Other Ambulatory Visit: Payer: Self-pay

## 2019-06-04 DIAGNOSIS — F17210 Nicotine dependence, cigarettes, uncomplicated: Secondary | ICD-10-CM | POA: Insufficient documentation

## 2019-06-04 DIAGNOSIS — J45909 Unspecified asthma, uncomplicated: Secondary | ICD-10-CM | POA: Insufficient documentation

## 2019-06-04 DIAGNOSIS — Z7982 Long term (current) use of aspirin: Secondary | ICD-10-CM | POA: Insufficient documentation

## 2019-06-04 DIAGNOSIS — J209 Acute bronchitis, unspecified: Secondary | ICD-10-CM | POA: Insufficient documentation

## 2019-06-04 DIAGNOSIS — K122 Cellulitis and abscess of mouth: Secondary | ICD-10-CM

## 2019-06-04 DIAGNOSIS — R0789 Other chest pain: Secondary | ICD-10-CM | POA: Insufficient documentation

## 2019-06-04 DIAGNOSIS — Z79899 Other long term (current) drug therapy: Secondary | ICD-10-CM | POA: Insufficient documentation

## 2019-06-04 NOTE — ED Triage Notes (Signed)
Pt reports chest tightness and sob for the past few days, hx of bronchitis. Slight wheezing noted in triage.

## 2019-06-04 NOTE — ED Provider Notes (Addendum)
TIME SEEN: 11:58 PM  CHIEF COMPLAINT: Shortness of breath, wheezing, sore throat  HPI: Patient is a 58 year old female with history of asthma, chronic bronchitis, tobacco use who presents the emergency department with 1 week of shortness of breath, wheezing, dry cough, sore throat.  No fevers or chills.  States that her uvula is swollen.  She has had this happen before.  She has never seen a pulmonologist.  States she has 2 inhalers at home but has run out of 1 of them.  She states sometimes she has some chest tightness with the symptoms which is normal for her.  No exposures to coronavirus.  ROS: See HPI Constitutional: no fever  Eyes: no drainage  ENT: no runny nose   Cardiovascular:   chest pain  Resp:  SOB  GI: no vomiting GU: no dysuria Integumentary: no rash  Allergy: no hives  Musculoskeletal: no leg swelling  Neurological: no slurred speech ROS otherwise negative  PAST MEDICAL HISTORY/PAST SURGICAL HISTORY:  Past Medical History:  Diagnosis Date  . Anemia   . Asthma   . Chronic bronchitis ([place])   . History of blood transfusion 08/07/2013   "first one I've ever had was today" (08/07/2013)    MEDICATIONS:  Prior to Admission medications   Medication Sig Start Date End Date Taking? Authorizing Provider  albuterol (PROVENTIL HFA;VENTOLIN HFA) 108 (90 Base) MCG/ACT inhaler Inhale 1-2 puffs into the lungs every 6 (six) hours as needed for wheezing or shortness of breath. Patient not taking: Reported on 10/26/2017 02/17/16   Sam, Olivia Canter, PA-C  aspirin EC 325 MG tablet Take 1 tablet (325 mg total) by mouth 2 (two) times daily. 08/16/15   Leandrew Koyanagi, MD  ibuprofen (ADVIL,MOTRIN) 800 MG tablet Take 1 tablet (800 mg total) by mouth 3 (three) times daily. Patient taking differently: Take 800 mg by mouth every 6 (six) hours as needed for mild pain.  02/17/16   Sam, Olivia Canter, PA-C  triamcinolone lotion (KENALOG) 0.1 % Apply 1 application topically 3 (three) times daily. Patient not  taking: Reported on 08/03/2017 02/17/16   Sam, Olivia Canter, PA-C  famotidine (PEPCID) 20 MG tablet Take 1 tablet (20 mg total) by mouth 2 (two) times daily. Patient not taking: Reported on 08/12/2015 07/19/15 08/12/15  Pisciotta, Elmyra Ricks, PA-C    ALLERGIES:  Allergies  Allergen Reactions  . Bee Venom Hives and Rash    SOCIAL HISTORY:  Social History   Tobacco Use  . Smoking status: Current Every Day Smoker    Packs/day: 0.25    Years: 32.00    Pack years: 8.00    Types: Cigarettes    Last attempt to quit: 02/14/2014    Years since quitting: 5.3  . Smokeless tobacco: Former Systems developer    Quit date: 02/14/2014  Substance Use Topics  . Alcohol use: Yes    Comment: 08/07/2013 "holidays, birthdays I'll have a mixed drink or glass of wine"    FAMILY HISTORY: Family History  Problem Relation Age of Onset  . Breast cancer Mother   . Diabetes Mellitus II Father   . Colon cancer Neg Hx     EXAM: BP (!) 153/97   Pulse 90   Temp 98.5 F (36.9 C) (Oral)   Resp 18   LMP 02/12/2014   SpO2 97%  CONSTITUTIONAL: Alert and oriented and responds appropriately to questions. Well-appearing; well-nourished HEAD: Normocephalic EYES: Conjunctivae clear, pupils appear equal, EOMI ENT: normal nose; moist mucous membranes; No pharyngeal erythema or petechiae, no  tonsillar hypertrophy or exudate, no uvular deviation, no unilateral swelling, no trismus or drooling, patient has mild uvular swelling without redness, slightly hoarse voice, no muffled voice, no stridor, no angioedema, no facial erythema or warmth, no facial swelling; no pain with movement of the neck. NECK: Supple, no meningismus, no nuchal rigidity, no LAD  CARD: RRR; S1 and S2 appreciated; no murmurs, no clicks, no rubs, no gallops RESP: Normal chest excursion without splinting or tachypnea; breath sounds clear and equal bilaterally; patient has scattered expiratory wheezes, no rhonchi or rales, no hypoxia or respiratory distress, slightly hoarse  voice, speaking full sentences ABD/GI: Normal bowel sounds; non-distended; soft, non-tender, no rebound, no guarding, no peritoneal signs, no hepatosplenomegaly BACK:  The back appears normal and is non-tender to palpation, there is no CVA tenderness EXT: Normal ROM in all joints; non-tender to palpation; no edema; normal capillary refill; no cyanosis, no calf tenderness or swelling    SKIN: Normal color for age and race; warm; no rash NEURO: Moves all extremities equally PSYCH: The patient's mood and manner are appropriate. Grooming and personal hygiene are appropriate.  MEDICAL DECISION MAKING: Patient here with asthma exacerbation, uvulitis.  No signs of strep pharyngitis, deep space neck infection, PTA.  Her chest x-ray shows no pneumonia.  Doubt coronavirus given no fever.  Suspect that this is her chronic asthma.  Will give Solu-Medrol, albuterol and reassess.  Doubt ACS, PE, dissection.  EKG shows no ischemic abnormality.  ED PROGRESS: Patient reports she is feeling much better.  No worsening of her uvulitis.  I feel steroids would be appropriate to treat both her asthma and uvulitis.  I do not feel she needs antibiotics today and she is in agreement.  Her lungs are now clear to auscultation bilaterally she has no hypoxia.  Will discharge with albuterol inhaler and prednisone burst.   At this time, I do not feel there is any life-threatening condition present. I have reviewed and discussed all results (EKG, imaging, lab, urine as appropriate) and exam findings with patient/family. I have reviewed nursing notes and appropriate previous records.  I feel the patient is safe to be discharged home without further emergent workup and can continue workup as an outpatient as needed. Discussed usual and customary return precautions. Patient/family verbalize understanding and are comfortable with this plan.  Outpatient follow-up has been provided as needed. All questions have been answered.       EKG  Interpretation  Date/Time:  Sunday June 04 2019 22:53:52 EDT Ventricular Rate:  87 PR Interval:  168 QRS Duration: 96 QT Interval:  366 QTC Calculation: 440 R Axis:   84 Text Interpretation:  Normal sinus rhythm Normal ECG T wave abnormalities have resolved Confirmed by Pryor Curia [phone]) on 06/04/2019 11:42:29 PM         Elie Leppo, Delice Bison, DO 06/05/19 [place], Delice Bison, DO 06/05/19 [id number]

## 2019-06-05 MED ORDER — PREDNISONE 20 MG PO TABS: 60.0000 mg | ORAL_TABLET | Freq: Every day | ORAL | 0 refills | Status: DC

## 2019-06-05 MED ORDER — METHYLPREDNISOLONE SODIUM SUCC 125 MG IJ SOLR
125.0000 mg | Freq: Once | INTRAMUSCULAR | Status: AC
  Administered 2019-06-05: 125 mg via INTRAMUSCULAR
  Filled 2019-06-05: qty 2

## 2019-06-05 MED ORDER — ALBUTEROL SULFATE HFA 108 (90 BASE) MCG/ACT IN AERS
8.0000 | INHALATION_SPRAY | Freq: Once | RESPIRATORY_TRACT | Status: AC
  Administered 2019-06-05: 8 via RESPIRATORY_TRACT
  Filled 2019-06-05: qty 6.7

## 2019-06-05 NOTE — ED Notes (Signed)
Discharge instructions discussed with pt. Pt verbalized understanding. Pt stable and ambulatory. No signature pad available. 

## 2019-06-07 ENCOUNTER — Other Ambulatory Visit: Payer: Self-pay | Admitting: *Deleted

## 2019-06-07 DIAGNOSIS — Z20822 Contact with and (suspected) exposure to covid-19: Secondary | ICD-10-CM

## 2019-06-07 NOTE — Addendum Note (Signed)
Addended by: Brigitte Pulse on: 06/07/2019 01:42 PM   Modules accepted: Orders

## 2019-06-07 NOTE — Progress Notes (Signed)
LA 

## 2019-06-11 LAB — SPECIMEN STATUS REPORT

## 2019-06-11 LAB — NOVEL CORONAVIRUS, NAA: SARS-CoV-2, NAA: NOT DETECTED

## 2019-06-12 ENCOUNTER — Other Ambulatory Visit: Payer: Self-pay

## 2019-06-12 ENCOUNTER — Emergency Department (HOSPITAL_COMMUNITY): Payer: Self-pay

## 2019-06-12 ENCOUNTER — Inpatient Hospital Stay (HOSPITAL_COMMUNITY)
Admission: EM | Admit: 2019-06-12 | Discharge: 2019-06-14 | DRG: 916 | Disposition: A | Discharge status: Home / Self Care | Payer: Self-pay | Attending: Internal Medicine | Admitting: Internal Medicine

## 2019-06-12 ENCOUNTER — Encounter (HOSPITAL_COMMUNITY): Payer: Self-pay | Admitting: Emergency Medicine

## 2019-06-12 DIAGNOSIS — R061 Stridor: Secondary | ICD-10-CM | POA: Diagnosis present

## 2019-06-12 DIAGNOSIS — Z79899 Other long term (current) drug therapy: Secondary | ICD-10-CM

## 2019-06-12 DIAGNOSIS — Z6834 Body mass index (BMI) 34.0-34.9, adult: Secondary | ICD-10-CM

## 2019-06-12 DIAGNOSIS — Z20828 Contact with and (suspected) exposure to other viral communicable diseases: Secondary | ICD-10-CM | POA: Diagnosis present

## 2019-06-12 DIAGNOSIS — F17210 Nicotine dependence, cigarettes, uncomplicated: Secondary | ICD-10-CM | POA: Diagnosis present

## 2019-06-12 DIAGNOSIS — R739 Hyperglycemia, unspecified: Secondary | ICD-10-CM | POA: Diagnosis present

## 2019-06-12 DIAGNOSIS — J42 Unspecified chronic bronchitis: Secondary | ICD-10-CM | POA: Diagnosis present

## 2019-06-12 DIAGNOSIS — Z833 Family history of diabetes mellitus: Secondary | ICD-10-CM

## 2019-06-12 DIAGNOSIS — Z91030 Bee allergy status: Secondary | ICD-10-CM

## 2019-06-12 DIAGNOSIS — E042 Nontoxic multinodular goiter: Secondary | ICD-10-CM | POA: Diagnosis present

## 2019-06-12 DIAGNOSIS — Z803 Family history of malignant neoplasm of breast: Secondary | ICD-10-CM

## 2019-06-12 DIAGNOSIS — T380X5A Adverse effect of glucocorticoids and synthetic analogues, initial encounter: Secondary | ICD-10-CM | POA: Diagnosis present

## 2019-06-12 DIAGNOSIS — J45909 Unspecified asthma, uncomplicated: Secondary | ICD-10-CM | POA: Diagnosis present

## 2019-06-12 DIAGNOSIS — D72829 Elevated white blood cell count, unspecified: Secondary | ICD-10-CM | POA: Diagnosis present

## 2019-06-12 DIAGNOSIS — L509 Urticaria, unspecified: Secondary | ICD-10-CM | POA: Diagnosis present

## 2019-06-12 DIAGNOSIS — T7840XA Allergy, unspecified, initial encounter: Principal | ICD-10-CM | POA: Diagnosis present

## 2019-06-12 DIAGNOSIS — E669 Obesity, unspecified: Secondary | ICD-10-CM | POA: Diagnosis present

## 2019-06-12 DIAGNOSIS — R490 Dysphonia: Secondary | ICD-10-CM | POA: Diagnosis present

## 2019-06-12 LAB — CBC
HCT: 42.8 % (ref 36.0–46.0)
Hemoglobin: 13.7 g/dL (ref 12.0–15.0)
MCH: 31.3 pg (ref 26.0–34.0)
MCHC: 32 g/dL (ref 30.0–36.0)
MCV: 97.7 fL (ref 80.0–100.0)
Platelets: 257 10*3/uL (ref 150–400)
RBC: 4.38 MIL/uL (ref 3.87–5.11)
RDW: 12.4 % (ref 11.5–15.5)
WBC: 15.9 10*3/uL — ABNORMAL HIGH (ref 4.0–10.5)
nRBC: 0 % (ref 0.0–0.2)

## 2019-06-12 LAB — BASIC METABOLIC PANEL
Anion gap: 12 (ref 5–15)
BUN: 15 mg/dL (ref 6–20)
CO2: 24 mmol/L (ref 22–32)
Calcium: 8.8 mg/dL — ABNORMAL LOW (ref 8.9–10.3)
Chloride: 101 mmol/L (ref 98–111)
Creatinine, Ser: 0.95 mg/dL (ref 0.44–1.00)
GFR calc Af Amer: >60 mL/min (ref >60)
GFR calc non Af Amer: >60 mL/min (ref >60)
Glucose, Bld: 173 mg/dL — ABNORMAL HIGH (ref 70–99)
Potassium: 3.6 mmol/L (ref 3.5–5.1)
Sodium: 137 mmol/L (ref 135–145)

## 2019-06-12 MED ORDER — METHYLPREDNISOLONE SODIUM SUCC 125 MG IJ SOLR
125.0000 mg | Freq: Once | INTRAMUSCULAR | Status: AC
Start: 2019-06-12 — End: 2019-06-12
  Administered 2019-06-12: 125 mg via INTRAVENOUS
  Filled 2019-06-12: qty 2

## 2019-06-12 MED ORDER — EPINEPHRINE 0.3 MG/0.3ML IJ SOAJ
0.3000 mg | Freq: Once | INTRAMUSCULAR | Status: AC
  Administered 2019-06-12: 21:00:00 0.3 mg via INTRAMUSCULAR
  Filled 2019-06-12: qty 0.3

## 2019-06-12 MED ORDER — IOHEXOL 300 MG/ML  SOLN
75.0000 mL | Freq: Once | INTRAMUSCULAR | Status: AC | PRN
  Administered 2019-06-12: 75 mL via INTRAVENOUS

## 2019-06-12 MED ORDER — DIPHENHYDRAMINE HCL 50 MG/ML IJ SOLN
25.0000 mg | Freq: Once | INTRAMUSCULAR | Status: AC
Start: 2019-06-12 — End: 2019-06-12
  Administered 2019-06-12: 25 mg via INTRAVENOUS
  Filled 2019-06-12: qty 1

## 2019-06-12 MED ORDER — ALBUTEROL SULFATE (2.5 MG/3ML) 0.083% IN NEBU
5.0000 mg | INHALATION_SOLUTION | Freq: Once | RESPIRATORY_TRACT | Status: AC
  Administered 2019-06-12: 5 mg via RESPIRATORY_TRACT
  Filled 2019-06-12: qty 6

## 2019-06-12 MED ORDER — FAMOTIDINE IN NACL 20-0.9 MG/50ML-% IV SOLN
20.0000 mg | Freq: Once | INTRAVENOUS | Status: AC
  Administered 2019-06-12: 20 mg via INTRAVENOUS
  Filled 2019-06-12: qty 50

## 2019-06-12 NOTE — H&P (Signed)
History and Physical   Shawna Rodgers JOI:[id number] DOB: 01/06/1961 DOA: 06/12/2019  Referring MD/NP/PA: Dorie Rank, MD  PCP: [facility]   Outpatient Specialists: None  Patient coming from: Home  Chief Complaint: Shortness of breath and choking  HPI: Shawna Rodgers is a 58 y.o. female with medical history significant of asthma, chronic bronchitis and tobacco abuse with recent cessation who has been having recurrent hives for a while.  Also has had recurrent shortness of breath on and off.  Patient quit smoking a few days ago.  Has current symptoms started a couple of weeks ago.  She felt shortness of breath with swelling on her face uvula which again happened this past few days.  She was seen in the ER on June 28 was evaluated and given breathing treatments as well as steroids.  Symptoms have improved.  She completed prednisone.  When the last few days symptoms have come back again she started noticing the rashes her face was swollen and then now having hoarseness in her voice as well as difficulty breathing with stridor.  In the ER patient was noted to have hives all over her skin with the stridor.  No fever or chills.  Patient has chronically had NSAIDS of different type.  She is currently on naproxen.  Otherwise no other medications.  Denied any known allergen that she may be exposed to.  She continues to have hoarseness at the moment but no compromise breathing.  We are admitting her with possible allergic reactions with stridor.  Possible angioedema also..  ED Course: Temperature is 100.1 blood pressure 168/109 pulse 94, respiratory rate of 26 oxygen sat 95% on room air.  White count is 15.9 otherwise CBC and chemistry appeared to be within normal.  Glucose is elevated at 173.  COVID-19 testing so far negative. CT neck showed 1 cm cystic lesion along the midline of the subglottic airway.  This is indeterminate and could be a small vocal cord of polyp.  Recommended ENT referral.  Also have a 19  mm right thyroid nodules which is indeterminate.  Another 4 mm left upper lobe nodule.  Chest x-ray so far negative.  Patient being admitted for work-up.  COVID-19 test pending.  Review of Systems: As per HPI otherwise 10 point review of systems negative.    Past Medical History:  Diagnosis Date  . Anemia   . Asthma   . Chronic bronchitis ([place])   . History of blood transfusion 08/07/2013   "first one I've ever had was today" (08/07/2013)    Past Surgical History:  Procedure Laterality Date  . CYSTOSCOPY N/A 05/24/2014   Procedure: CYSTOSCOPY;  Surgeon: Lavonia Drafts, MD;  Location: [facility];  Service: Gynecology;  Laterality: N/A;  . I&D EXTREMITY Left 08/16/2015   Procedure: IRRIGATION AND DEBRIDEMENT LEFT ANKLE AND EXPLORATION;  Surgeon: Leandrew Koyanagi, MD;  Location: [facility];  Service: Orthopedics;  Laterality: Left;  . ROBOTIC ASSISTED TOTAL HYSTERECTOMY N/A 05/24/2014   Procedure: ATTEMPTED ROBOTIC ASSISTED TOTAL HYSTERECTOMY ;  Surgeon: Lavonia Drafts, MD;  Location: [facility];  Service: Gynecology;  Laterality: N/A;  wound class contaminated  . TENDON REPAIR Left 08/16/2015   Procedure: TENDON REPAIR;  Surgeon: Leandrew Koyanagi, MD;  Location: [facility];  Service: Orthopedics;  Laterality: Left;  Marland Kitchen VAGINAL HYSTERECTOMY N/A 05/24/2014   Procedure: HYSTERECTOMY VAGINAL,;  Surgeon: Lavonia Drafts, MD;  Location: [facility];  Service: Gynecology;  Laterality: N/A;  converted to vaginal procedure  at 0926     reports that she has been smoking cigarettes. She has a 8.00 pack-year smoking history. She quit smokeless tobacco use about 5 years ago. She reports current alcohol use. She reports that she does not use drugs.  Allergies  Allergen Reactions  . Bee Venom Hives and Rash    Family History  Problem Relation Age of Onset  . Breast cancer Mother   . Diabetes Mellitus II Father   . Colon cancer Neg Hx      Prior to Admission medications    Medication Sig Start Date End Date Taking? Authorizing Provider  albuterol (PROVENTIL HFA;VENTOLIN HFA) 108 (90 Base) MCG/ACT inhaler Inhale 1-2 puffs into the lungs every 6 (six) hours as needed for wheezing or shortness of breath. 02/17/16  Yes Sam, Serena Y, PA-C  naproxen sodium (ALEVE) 220 MG tablet Take 440 mg by mouth 2 (two) times daily as needed (pain/headache).   Yes [provider]  aspirin EC 325 MG tablet Take 1 tablet (325 mg total) by mouth 2 (two) times daily. Patient not taking: Reported on 06/05/2019 08/16/15   Leandrew Koyanagi, MD  ibuprofen (ADVIL,MOTRIN) 800 MG tablet Take 1 tablet (800 mg total) by mouth 3 (three) times daily. Patient not taking: Reported on 06/05/2019 02/17/16   Sam, Olivia Canter, PA-C  predniSONE (DELTASONE) 20 MG tablet Take 3 tablets (60 mg total) by mouth daily. 06/05/19   Ward, Delice Bison, DO  triamcinolone lotion (KENALOG) 0.1 % Apply 1 application topically 3 (three) times daily. Patient not taking: Reported on 08/03/2017 02/17/16   Sam, Olivia Canter, PA-C  famotidine (PEPCID) 20 MG tablet Take 1 tablet (20 mg total) by mouth 2 (two) times daily. Patient not taking: Reported on 08/12/2015 07/19/15 08/12/15  Waynetta Pean    Physical Exam: Vitals:   06/12/19 2031 06/12/19 2047 06/12/19 2200 06/12/19 2230  BP:  (!) 168/109 (!) 134/92 (!) 118/91  Pulse:  94 86   Resp:  (!) 26 17 20   Temp:  99 F (37.2 C)    TempSrc:  Oral    SpO2:  99% 96%   Weight: 91.2 kg     Height: 5\' 5"  (1.651 m)         Constitutional: NAD, calm, morbidly obese Vitals:   06/12/19 2031 06/12/19 2047 06/12/19 2200 06/12/19 2230  BP:  (!) 168/109 (!) 134/92 (!) 118/91  Pulse:  94 86   Resp:  (!) 26 17 20   Temp:  99 F (37.2 C)    TempSrc:  Oral    SpO2:  99% 96%   Weight: 91.2 kg     Height: 5\' 5"  (1.651 m)      Eyes: PERRL, lids and conjunctivae normal ENMT: Mucous membranes are moist. Posterior pharynx clear of any exudate or lesions.Normal dentition.  Hoarse  voice Neck: normal, supple, no masses, no thyromegaly Respiratory: Good air entry bilaterally but marked stridor in the expiratory phase, no obvious crackles, distant expiratory wheezing normal respiratory effort. No accessory muscle use.  Cardiovascular: Regular rate and rhythm, no murmurs / rubs / gallops. No extremity edema. 2+ pedal pulses. No carotid bruits.  Abdomen: no tenderness, no masses palpated. No hepatosplenomegaly. Bowel sounds positive.  Musculoskeletal: no clubbing / cyanosis. No joint deformity upper and lower extremities. Good ROM, no contractures. Normal muscle tone.  Skin: no rashes, lesions, ulcers. No induration Neurologic: CN 2-12 grossly intact. Sensation intact, DTR normal. Strength 5/5 in all 4.  Psychiatric: Normal judgment and insight.  Alert and oriented x 3. Normal mood.     Labs on Admission: I have personally reviewed following labs and imaging studies  CBC: Recent Labs  Lab 06/12/19 2055  WBC 15.9*  HGB 13.7  HCT 42.8  MCV 97.7  PLT 914   Basic Metabolic Panel: Recent Labs  Lab 06/12/19 2055  NA 137  K 3.6  CL 101  CO2 24  GLUCOSE 173*  BUN 15  CREATININE 0.95  CALCIUM 8.8*   GFR: Estimated Creatinine Clearance: 73.8 mL/min (by C-G formula based on SCr of 0.95 mg/dL). Liver Function Tests: No results for input(s): AST, ALT, ALKPHOS, BILITOT, PROT, ALBUMIN in the last 168 hours. No results for input(s): LIPASE, AMYLASE in the last 168 hours. No results for input(s): AMMONIA in the last 168 hours. Coagulation Profile: No results for input(s): INR, PROTIME in the last 168 hours. Cardiac Enzymes: No results for input(s): CKTOTAL, CKMB, CKMBINDEX, TROPONINI in the last 168 hours. BNP (last 3 results) No results for input(s): PROBNP in the last 8760 hours. HbA1C: No results for input(s): HGBA1C in the last 72 hours. CBG: No results for input(s): GLUCAP in the last 168 hours. Lipid Profile: No results for input(s): CHOL, HDL, LDLCALC,  TRIG, CHOLHDL, LDLDIRECT in the last 72 hours. Thyroid Function Tests: No results for input(s): TSH, T4TOTAL, FREET4, T3FREE, THYROIDAB in the last 72 hours. Anemia Panel: No results for input(s): VITAMINB12, FOLATE, FERRITIN, TIBC, IRON, RETICCTPCT in the last 72 hours. Urine analysis:    Component Value Date/Time   COLORURINE YELLOW 08/03/2017 1033   APPEARANCEUR CLOUDY (A) 08/03/2017 1033   LABSPEC 1.018 08/03/2017 1033   PHURINE 6.0 08/03/2017 1033   GLUCOSEU NEGATIVE 08/03/2017 1033   HGBUR LARGE (A) 08/03/2017 1033   BILIRUBINUR NEGATIVE 08/03/2017 1033   KETONESUR NEGATIVE 08/03/2017 1033   PROTEINUR 100 (A) 08/03/2017 1033   UROBILINOGEN 0.2 12/06/2013 1353   NITRITE NEGATIVE 08/03/2017 1033   LEUKOCYTESUR LARGE (A) 08/03/2017 1033   Sepsis Labs: @LABRCNTIP (procalcitonin:4,lacticidven:4) ) Recent Results (from the past 240 hour(s))  Novel Coronavirus, NAA ([facility])     Status: None   Collection Time: 06/07/19 12:00 AM  Result Value Ref Range Status   SARS-CoV-2, NAA Not Detected Not Detected Final    Comment: This test was developed and its performance characteristics determined by [employer]. This test has not been FDA cleared or approved. This test has been authorized by FDA under an Emergency Use Authorization (EUA). This test is only authorized for the duration of time the declaration that circumstances exist justifying the authorization of the emergency use of in vitro diagnostic tests for detection of SARS-CoV-2 virus and/or diagnosis of COVID-19 infection under section 564(b)(1) of the Act, 21 U.S.C. 782NFA-2(Z)(3), unless the authorization is terminated or revoked sooner. When diagnostic testing is negative, the possibility of a false negative result should be considered in the context of a patient's recent exposures and the presence of clinical signs and symptoms consistent with COVID-19. An individual without symptoms of COVID-19 and who is not  shedding SARS-CoV-2 virus would expect to have a negative (not detected) result in this assay.      Radiological Exams on Admission: Dg Chest Portable 1 View  Result Date: 06/12/2019 CLINICAL DATA:  Shortness of breath EXAM: PORTABLE CHEST 1 VIEW COMPARISON:  June 04, 2019 FINDINGS: Lungs are clear. Heart size and pulmonary vascularity are normal. No adenopathy. No bone lesions. IMPRESSION: No edema or consolidation. Electronically Signed   By: Lowella Grip III M.D.  On: 06/12/2019 21:29      Assessment/Plan Principal Problem:   Expiratory stridor Active Problems:   Hyperglycemia   Leucocytosis   Hives     #1 stridor and hoarseness of the voice: Could be related to significant allergic reaction.  Also vocal cord polyp or cyst as indicated.  This however will not explain the hives.  Patient will be admitted and started on steroids with breathing treatments.  ENT consult in the morning or outpatient referral if she improves.  If this is proven to be significant allergies patient may require EpiPen at discharge especially with her shortness of breath and swollen face.  We will stop all nonsteroidal anti-inflammatory agents as the only medication that she is on which could be responsible.  Patient may also need to have further evaluation of her surrounding environment to see if there is any allergy she is exposing herself to.  #2 hives: Again this appeared to be more allergic in nature.  She will be on steroids and continue as above.  #3 hyperglycemia: Most likely secondary to recent steroids.  Continue to monitor.  #4 leukocytosis: Again most likely secondary to steroids.  Follow white count.  #5 tobacco abuse: Patient has quit now.  She does not require any nicotine patch.  #6 history of asthma or bronchitis: Not on any scheduled medications at the moment.  Continue albuterol MDI in the hospital with the steroids.  No obvious wheezing to suggest acute exacerbation.    DVT  prophylaxis: Lovenox Code Status: Full code Family Communication: No family at bedside Disposition Plan: Home Consults called: Patient will need ENT consult in the morning Admission status: Observation  Severity of Illness: The appropriate patient status for this patient is OBSERVATION. Observation status is judged to be reasonable and necessary in order to provide the required intensity of service to ensure the patient's safety. The patient's presenting symptoms, physical exam findings, and initial radiographic and laboratory data in the context of their medical condition is felt to place them at decreased risk for further clinical deterioration. Furthermore, it is anticipated that the patient will be medically stable for discharge from the hospital within 2 midnights of admission. The following factors support the patient status of observation.   " The patient's presenting symptoms include shortness of breath with stridor. " The physical exam findings include stridor. " The initial radiographic and laboratory data are CT findings of possible polyps in the laryngeal area.     Barbette Merino MD [facility] Pager [phone]  If 7PM-7AM, please contact night-coverage [email or web address] Password [email or web address]  06/12/2019, 11:18 PM

## 2019-06-12 NOTE — ED Provider Notes (Signed)
[facility] Provider Note   CSN: [id number] Arrival date & time: 06/12/19  2024    History   Chief Complaint Chief Complaint  Patient presents with   Shortness of Breath    HPI Shawna Rodgers is a 58 y.o. female.     HPI Patient presents to the ED for evaluation of shortness of breath.  Patient states her symptoms started a couple of weeks ago.  She was having issues with shortness of breath and wheezing but no fevers or chills.  She felt that her uvula was also swollen.  She went to the emergency room on June 28.  She was evaluated.  She was given breathing treatments and her symptoms improved.  Patient states however she started having symptoms again over the last several days.  Today her symptoms got worse and she started noticing a rash.  Patient denies any new medications.  No known allergic exposures.  No fevers or chills.  No chest pain. Past Medical History:  Diagnosis Date   Anemia    Asthma    Chronic bronchitis ([place])    History of blood transfusion 08/07/2013   "first one I've ever had was today" (08/07/2013)    Patient Active Problem List   Diagnosis Date Noted   Post-operative state 05/24/2014   Fibroids 02/09/2014   Menorrhagia 02/09/2014   UTI (lower urinary tract infection) 08/07/2013   Anemia 08/07/2013    Past Surgical History:  Procedure Laterality Date   CYSTOSCOPY N/A 05/24/2014   Procedure: CYSTOSCOPY;  Surgeon: Lavonia Drafts, MD;  Location: [facility];  Service: Gynecology;  Laterality: N/A;   I&D EXTREMITY Left 08/16/2015   Procedure: IRRIGATION AND DEBRIDEMENT LEFT ANKLE AND EXPLORATION;  Surgeon: Leandrew Koyanagi, MD;  Location: [facility];  Service: Orthopedics;  Laterality: Left;   ROBOTIC ASSISTED TOTAL HYSTERECTOMY N/A 05/24/2014   Procedure: ATTEMPTED ROBOTIC ASSISTED TOTAL HYSTERECTOMY ;  Surgeon: Lavonia Drafts, MD;  Location: [facility];  Service: Gynecology;  Laterality:  N/A;  wound class contaminated   TENDON REPAIR Left 08/16/2015   Procedure: TENDON REPAIR;  Surgeon: Leandrew Koyanagi, MD;  Location: [facility];  Service: Orthopedics;  Laterality: Left;   VAGINAL HYSTERECTOMY N/A 05/24/2014   Procedure: HYSTERECTOMY VAGINAL,;  Surgeon: Lavonia Drafts, MD;  Location: [facility];  Service: Gynecology;  Laterality: N/A;  converted to vaginal procedure at 0926     OB History    Gravida  4   Para  3   Term  3   Preterm      AB  1   Living  3     SAB  0   TAB  1   Ectopic  0   Multiple  0   Live Births               Home Medications    Prior to Admission medications   Medication Sig Start Date End Date Taking? Authorizing Provider  albuterol (PROVENTIL HFA;VENTOLIN HFA) 108 (90 Base) MCG/ACT inhaler Inhale 1-2 puffs into the lungs every 6 (six) hours as needed for wheezing or shortness of breath. 02/17/16  Yes Sam, Serena Y, PA-C  naproxen sodium (ALEVE) 220 MG tablet Take 440 mg by mouth 2 (two) times daily as needed (pain/headache).   Yes [provider]  aspirin EC 325 MG tablet Take 1 tablet (325 mg total) by mouth 2 (two) times daily. Patient not taking: Reported on 06/05/2019 08/16/15   Frankey Shown  M, MD  ibuprofen (ADVIL,MOTRIN) 800 MG tablet Take 1 tablet (800 mg total) by mouth 3 (three) times daily. Patient not taking: Reported on 06/05/2019 02/17/16   Sam, Olivia Canter, PA-C  predniSONE (DELTASONE) 20 MG tablet Take 3 tablets (60 mg total) by mouth daily. 06/05/19   Ward, Delice Bison, DO  triamcinolone lotion (KENALOG) 0.1 % Apply 1 application topically 3 (three) times daily. Patient not taking: Reported on 08/03/2017 02/17/16   Sam, Olivia Canter, PA-C  famotidine (PEPCID) 20 MG tablet Take 1 tablet (20 mg total) by mouth 2 (two) times daily. Patient not taking: Reported on 08/12/2015 07/19/15 08/12/15  Pisciotta, Elmyra Ricks, PA-C    Family History Family History  Problem Relation Age of Onset   Breast cancer Mother     Diabetes Mellitus II Father    Colon cancer Neg Hx     Social History Social History   Tobacco Use   Smoking status: Current Every Day Smoker    Packs/day: 0.25    Years: 32.00    Pack years: 8.00    Types: Cigarettes    Last attempt to quit: 02/14/2014    Years since quitting: 5.3   Smokeless tobacco: Former Systems developer    Quit date: 02/14/2014  Substance Use Topics   Alcohol use: Yes    Comment: 08/07/2013 "holidays, birthdays I'll have a mixed drink or glass of wine"   Drug use: No     Allergies   Bee venom   Review of Systems Review of Systems  All other systems reviewed and are negative.    Physical Exam Updated Vital Signs BP (!) 118/91    Pulse 86    Temp 99 F (37.2 C) (Oral)    Resp 20    Ht 1.651 m (5\' 5" )    Wt 91.2 kg    LMP 02/12/2014    SpO2 96%    BMI 33.45 kg/m   Physical Exam Vitals signs and nursing note reviewed.  Constitutional:      General: She is not in acute distress.    Appearance: She is well-developed.  HENT:     Head: Normocephalic and atraumatic.     Comments: No uvula edema, no oropharyngeal edema    Right Ear: External ear normal.     Left Ear: External ear normal.  Eyes:     General: No scleral icterus.       Right eye: No discharge.        Left eye: No discharge.     Conjunctiva/sclera: Conjunctivae normal.  Neck:     Musculoskeletal: Neck supple.     Trachea: No tracheal deviation.  Cardiovascular:     Rate and Rhythm: Normal rate and regular rhythm.  Pulmonary:     Effort: Pulmonary effort is normal. Tachypnea present. No respiratory distress.     Breath sounds: Normal breath sounds. No stridor. No wheezing or rales.     Comments: Stridor noted on exam  Abdominal:     General: Bowel sounds are normal. There is no distension.     Palpations: Abdomen is soft.     Tenderness: There is no abdominal tenderness. There is no guarding or rebound.  Musculoskeletal:        General: No tenderness.  Skin:    General: Skin is  warm and dry.     Findings: Rash present. Rash is urticarial.  Neurological:     Mental Status: She is alert.     Cranial Nerves: No cranial nerve deficit (  no facial droop, extraocular movements intact, no slurred speech).     Sensory: No sensory deficit.     Motor: No abnormal muscle tone or seizure activity.     Coordination: Coordination normal.      ED Treatments / Results  Labs (all labs ordered are listed, but only abnormal results are displayed) Labs Reviewed  CBC - Abnormal; Notable for the following components:      Result Value   WBC 15.9 (*)    All other components within normal limits  BASIC METABOLIC PANEL - Abnormal; Notable for the following components:   Glucose, Bld 173 (*)    Calcium 8.8 (*)    All other components within normal limits    EKG None  Radiology Dg Chest Portable 1 View  Result Date: 06/12/2019 CLINICAL DATA:  Shortness of breath EXAM: PORTABLE CHEST 1 VIEW COMPARISON:  June 04, 2019 FINDINGS: Lungs are clear. Heart size and pulmonary vascularity are normal. No adenopathy. No bone lesions. IMPRESSION: No edema or consolidation. Electronically Signed   By: Lowella Grip III M.D.   On: 06/12/2019 21:29    Procedures .Critical Care Performed by: Dorie Rank, MD Authorized by: Dorie Rank, MD   Critical care provider statement:    Critical care time (minutes):  30   Critical care was time spent personally by me on the following activities:  Discussions with consultants, evaluation of patient's response to treatment, examination of patient, ordering and performing treatments and interventions, ordering and review of laboratory studies, ordering and review of radiographic studies, pulse oximetry, re-evaluation of patient's condition, obtaining history from patient or surrogate and review of old charts   (including critical care time)  Medications Ordered in ED Medications  methylPREDNISolone sodium succinate (SOLU-MEDROL) 125 mg/2 mL injection  125 mg (125 mg Intravenous Given 06/12/19 2115)  diphenhydrAMINE (BENADRYL) injection 25 mg (25 mg Intravenous Given 06/12/19 2115)  famotidine (PEPCID) IVPB 20 mg premix (0 mg Intravenous Stopped 06/12/19 2146)  EPINEPHrine (EPI-PEN) injection 0.3 mg (0.3 mg Intramuscular Given 06/12/19 2115)  albuterol (PROVENTIL) (2.5 MG/3ML) 0.083% nebulizer solution 5 mg (5 mg Nebulization Given 06/12/19 2116)     Initial Impression / Assessment and Plan / ED Course  I have reviewed the triage vital signs and the nursing notes.  Pertinent labs & imaging results that were available during my care of the patient were reviewed by me and considered in my medical decision making (see chart for details).  Clinical Course as of Jun 11 2238  Mon Jun 12, 2019  2233 Pt is feeling better.  VItals have improved.  Patient is sleeping but still has stridor.   [JK]    Clinical Course User Index [JK] Dorie Rank, MD     Patient presented to the ER with complaints of shortness of breath.  He was noted to have stridor on exam and did have a urticaria.  No clear etiology for this allergic type reaction.  Patient was given epinephrine Benadryl and steroids.  She has improved but she still has some stridulous breathing.  I think she would benefit from overnight observation to make sure the symptoms are resolved.  If they do not resolve she may need ENT consultation for laryngoscopy.  Final Clinical Impressions(s) / ED Diagnoses   Final diagnoses:  Allergic reaction, initial encounter  Stridor      Dorie Rank, MD 06/12/19 2239

## 2019-06-12 NOTE — ED Notes (Signed)
ED TO INPATIENT HANDOFF REPORT  ED Nurse Name and Phone #: Claiborne Billings [phone]  S Name/Age/Gender Shawna Rodgers 58 y.o. female Room/Bed: 029C/029C  Code Status   Code Status: Prior  Home/SNF/Other Home Patient oriented to: self, place, time and situation Is this baseline? Yes   Triage Complete: Triage complete  Chief Complaint SOB  Triage Note Pt c/o shortness of breath, hives and feeling hoarse x 2 weeks, worsening today.    Allergies Allergies  Allergen Reactions  . Bee Venom Hives and Rash    Level of Care/Admitting Diagnosis ED Disposition    ED Disposition Condition [facility] Area: [facility] [[id number]]  Level of Care: Telemetry Medical [104]  I expect the patient will be discharged within 24 hours: Yes  LOW acuity---Tx typically complete <24 hrs---ACUTE conditions typically can be evaluated <24 hours---LABS likely to return to acceptable levels <24 hours---IS near functional baseline---EXPECTED to return to current living arrangement---NOT newly hypoxic: Meets criteria for 5C-Observation unit  Covid Evaluation: Asymptomatic Screening Protocol (No Symptoms)  Diagnosis: Expiratory stridor [[id number]]  Admitting Physician: Elwyn Reach [[id number]]  Attending Physician: Elwyn Reach [[id number]]  PT Class (Do Not Modify): Observation [104]  PT Acc Code (Do Not Modify): Observation [10022]       B Medical/Surgery History Past Medical History:  Diagnosis Date  . Anemia   . Asthma   . Chronic bronchitis ([place])   . History of blood transfusion 08/07/2013   "first one I've ever had was today" (08/07/2013)   Past Surgical History:  Procedure Laterality Date  . CYSTOSCOPY N/A 05/24/2014   Procedure: CYSTOSCOPY;  Surgeon: Lavonia Drafts, MD;  Location: [facility];  Service: Gynecology;  Laterality: N/A;  . I&D EXTREMITY Left 08/16/2015   Procedure: IRRIGATION AND DEBRIDEMENT LEFT ANKLE AND EXPLORATION;  Surgeon: Leandrew Koyanagi, MD;  Location:  [facility];  Service: Orthopedics;  Laterality: Left;  . ROBOTIC ASSISTED TOTAL HYSTERECTOMY N/A 05/24/2014   Procedure: ATTEMPTED ROBOTIC ASSISTED TOTAL HYSTERECTOMY ;  Surgeon: Lavonia Drafts, MD;  Location: [facility];  Service: Gynecology;  Laterality: N/A;  wound class contaminated  . TENDON REPAIR Left 08/16/2015   Procedure: TENDON REPAIR;  Surgeon: Leandrew Koyanagi, MD;  Location: [facility];  Service: Orthopedics;  Laterality: Left;  Marland Kitchen VAGINAL HYSTERECTOMY N/A 05/24/2014   Procedure: HYSTERECTOMY VAGINAL,;  Surgeon: Lavonia Drafts, MD;  Location: [facility];  Service: Gynecology;  Laterality: N/A;  converted to vaginal procedure at 0926     A IV Location/Drains/Wounds Patient Lines/Drains/Airways Status   Active Line/Drains/Airways    Name:   Placement date:   Placement time:   Site:   Days:   Peripheral IV 06/12/19 Right Hand   06/12/19    2114    Hand   less than 1   Incision (Closed) 05/24/14 Perineum Other (Comment)   05/24/14    0837     1845   Incision (Closed) 05/24/14 Abdomen Other (Comment)   05/24/14    0837     1845   Incision (Closed) 08/16/15 Foot Left   08/16/15    1319     1396   Incision - 4 Ports Abdomen 1: Upper;Medial 2: Right;Medial;Lateral 3: Left;Medial;Lateral 4: Left;Upper;Lateral   05/24/14    0755     1845          Intake/Output Last 24 hours No intake or output data in the 24 hours ending 06/12/19 2341  Labs/Imaging Results for  orders placed or performed during the hospital encounter of 06/12/19 (from the past 48 hour(s))  CBC     Status: Abnormal   Collection Time: 06/12/19  8:55 PM  Result Value Ref Range   WBC 15.9 (H) 4.0 - 10.5 K/uL   RBC 4.38 3.87 - 5.11 MIL/uL   Hemoglobin 13.7 12.0 - 15.0 g/dL   HCT 42.8 36.0 - 46.0 %   MCV 97.7 80.0 - 100.0 fL   MCH 31.3 26.0 - 34.0 pg   MCHC 32.0 30.0 - 36.0 g/dL   RDW 12.4 11.5 - 15.5 %   Platelets 257 150 - 400 K/uL   nRBC 0.0 0.0 - 0.2 %    Comment: Performed at  [facility], [place] [address] [place], [place] [address]  Basic metabolic panel     Status: Abnormal   Collection Time: 06/12/19  8:55 PM  Result Value Ref Range   Sodium 137 135 - 145 mmol/L   Potassium 3.6 3.5 - 5.1 mmol/L   Chloride 101 98 - 111 mmol/L   CO2 24 22 - 32 mmol/L   Glucose, Bld 173 (H) 70 - 99 mg/dL   BUN 15 6 - 20 mg/dL   Creatinine, Ser 0.95 0.44 - 1.00 mg/dL   Calcium 8.8 (L) 8.9 - 10.3 mg/dL   GFR calc non Af Amer >60 >60 mL/min   GFR calc Af Amer >60 >60 mL/min   Anion gap 12 5 - 15    Comment: Performed at [facility], [place] [address] [place], [place] [address]   Dg Chest Portable 1 View  Result Date: 06/12/2019 CLINICAL DATA:  Shortness of breath EXAM: PORTABLE CHEST 1 VIEW COMPARISON:  June 04, 2019 FINDINGS: Lungs are clear. Heart size and pulmonary vascularity are normal. No adenopathy. No bone lesions. IMPRESSION: No edema or consolidation. Electronically Signed   By: Lowella Grip III M.D.   On: 06/12/2019 21:29    Pending Labs Unresulted Labs (From admission, onward)    Start     Ordered   06/12/19 2340  SARS Coronavirus 2 (CEPHEID - Performed in [facility] hospital lab), Hosp Order  (Asymptomatic Patients Labs)  Once,   STAT    Question:  Rule Out  Answer:  Yes   06/12/19 2339   Signed and Held  HIV antibody (Routine Testing)  Once,   R     Signed and Held   Signed and Held  CBC  (enoxaparin (LOVENOX)    CrCl >/= 30 ml/min)  Once,   R    Comments: Baseline for enoxaparin therapy IF NOT ALREADY DRAWN.  Notify MD if PLT < 100 K.    Signed and Held   Signed and Held  Creatinine, serum  (enoxaparin (LOVENOX)    CrCl >/= 30 ml/min)  Once,   R    Comments: Baseline for enoxaparin therapy IF NOT ALREADY DRAWN.    Signed and Held   Signed and Held  Creatinine, serum  (enoxaparin (LOVENOX)    CrCl >/= 30 ml/min)  Weekly,   R    Comments: while on enoxaparin therapy    Signed and Held   Signed and Held  Basic metabolic panel  Tomorrow  morning,   R     Signed and Held   Signed and Held  CBC  Tomorrow morning,   R     Signed and Held          Vitals/Pain Today's Vitals   06/12/19  2200 06/12/19 2230 06/12/19 2236 06/12/19 2330  BP: (!) 134/92 (!) 118/91  137/88  Pulse: 86   88  Resp: 17 20  15   Temp:      TempSrc:      SpO2: 96%   95%  Weight:      Height:      PainSc:   0-No pain     Isolation Precautions No active isolations  Medications Medications  methylPREDNISolone sodium succinate (SOLU-MEDROL) 125 mg/2 mL injection 125 mg (125 mg Intravenous Given 06/12/19 2115)  diphenhydrAMINE (BENADRYL) injection 25 mg (25 mg Intravenous Given 06/12/19 2115)  famotidine (PEPCID) IVPB 20 mg premix (0 mg Intravenous Stopped 06/12/19 2146)  EPINEPHrine (EPI-PEN) injection 0.3 mg (0.3 mg Intramuscular Given 06/12/19 2115)  albuterol (PROVENTIL) (2.5 MG/3ML) 0.083% nebulizer solution 5 mg (5 mg Nebulization Given 06/12/19 2116)  iohexol (OMNIPAQUE) 300 MG/ML solution 75 mL (75 mLs Intravenous Contrast Given 06/12/19 2306)    Mobility walks Low fall risk   Focused Assessments Pulmonary Assessment Handoff:  Lung sounds: Bilateral Breath Sounds: Expiratory wheezes L Breath Sounds: Expiratory wheezes R Breath Sounds: Expiratory wheezes O2 Device: Room Air        R Recommendations: See Admitting Provider Note  Report given to:   Additional Notes:  No pain, still some upper airway wheeze

## 2019-06-12 NOTE — ED Triage Notes (Addendum)
Pt c/o shortness of breath, hives and feeling hoarse x 2 weeks, worsening today.

## 2019-06-12 NOTE — ED Notes (Signed)
admitting Provider at bedside. 

## 2019-06-12 NOTE — ED Notes (Signed)
Pt reports her chest feels much better "not as tight".

## 2019-06-13 LAB — CBC
HCT: 42.9 % (ref 36.0–46.0)
Hemoglobin: 13.9 g/dL (ref 12.0–15.0)
MCH: 30.9 pg (ref 26.0–34.0)
MCHC: 32.4 g/dL (ref 30.0–36.0)
MCV: 95.3 fL (ref 80.0–100.0)
Platelets: 264 10*3/uL (ref 150–400)
RBC: 4.5 MIL/uL (ref 3.87–5.11)
RDW: 12.4 % (ref 11.5–15.5)
WBC: 16.1 10*3/uL — ABNORMAL HIGH (ref 4.0–10.5)
nRBC: 0 % (ref 0.0–0.2)

## 2019-06-13 LAB — BASIC METABOLIC PANEL
Anion gap: 9 (ref 5–15)
BUN: 11 mg/dL (ref 6–20)
CO2: 29 mmol/L (ref 22–32)
Calcium: 9.2 mg/dL (ref 8.9–10.3)
Chloride: 99 mmol/L (ref 98–111)
Creatinine, Ser: 0.83 mg/dL (ref 0.44–1.00)
GFR calc Af Amer: >60 mL/min (ref >60)
GFR calc non Af Amer: >60 mL/min (ref >60)
Glucose, Bld: 190 mg/dL — ABNORMAL HIGH (ref 70–99)
Potassium: 3.9 mmol/L (ref 3.5–5.1)
Sodium: 137 mmol/L (ref 135–145)

## 2019-06-13 LAB — GLUCOSE, CAPILLARY
Glucose-Capillary: 240 mg/dL — ABNORMAL HIGH (ref 70–99)
Glucose-Capillary: 173 mg/dL — ABNORMAL HIGH (ref 70–99)

## 2019-06-13 LAB — SARS CORONAVIRUS 2 BY RT PCR (HOSPITAL ORDER, PERFORMED IN ~~LOC~~ HOSPITAL LAB): SARS Coronavirus 2: NEGATIVE

## 2019-06-13 LAB — HEMOGLOBIN A1C
Hgb A1c MFr Bld: 5.4 % (ref 4.8–5.6)
Mean Plasma Glucose: 108.28 mg/dL

## 2019-06-13 LAB — HIV ANTIBODY (ROUTINE TESTING W REFLEX): HIV Screen 4th Generation wRfx: NONREACTIVE

## 2019-06-13 MED ORDER — ACETAMINOPHEN 325 MG PO TABS
650.0000 mg | ORAL_TABLET | Freq: Four times a day (QID) | ORAL | Status: DC | PRN
  Administered 2019-06-13: 650 mg via ORAL
  Filled 2019-06-13: qty 2

## 2019-06-13 MED ORDER — METHYLPREDNISOLONE SODIUM SUCC 40 MG IJ SOLR
40.0000 mg | Freq: Two times a day (BID) | INTRAMUSCULAR | Status: DC
  Administered 2019-06-13 – 2019-06-14 (×4): 40 mg via INTRAVENOUS
  Filled 2019-06-13 (×4): qty 1

## 2019-06-13 MED ORDER — IPRATROPIUM-ALBUTEROL 0.5-2.5 (3) MG/3ML IN SOLN
3.0000 mL | Freq: Two times a day (BID) | RESPIRATORY_TRACT | Status: DC
  Administered 2019-06-13 – 2019-06-14 (×2): 3 mL via RESPIRATORY_TRACT
  Filled 2019-06-13 (×2): qty 3

## 2019-06-13 MED ORDER — HYDRALAZINE HCL 20 MG/ML IJ SOLN
5.0000 mg | Freq: Four times a day (QID) | INTRAMUSCULAR | Status: DC | PRN
  Administered 2019-06-13: 5 mg via INTRAVENOUS
  Filled 2019-06-13: qty 1

## 2019-06-13 MED ORDER — ONDANSETRON HCL 4 MG PO TABS: 4.0000 mg | ORAL_TABLET | Freq: Four times a day (QID) | ORAL | Status: DC | PRN

## 2019-06-13 MED ORDER — ONDANSETRON HCL 4 MG/2ML IJ SOLN: 4.0000 mg | Freq: Four times a day (QID) | INTRAMUSCULAR | Status: DC | PRN

## 2019-06-13 MED ORDER — ALBUTEROL SULFATE (2.5 MG/3ML) 0.083% IN NEBU: 2.5000 mg | INHALATION_SOLUTION | Freq: Four times a day (QID) | RESPIRATORY_TRACT | Status: DC | PRN

## 2019-06-13 MED ORDER — IPRATROPIUM-ALBUTEROL 0.5-2.5 (3) MG/3ML IN SOLN: 3.0000 mL | Freq: Two times a day (BID) | RESPIRATORY_TRACT | Status: DC

## 2019-06-13 MED ORDER — PANTOPRAZOLE SODIUM 40 MG PO TBEC: 40.0000 mg | DELAYED_RELEASE_TABLET | Freq: Every day | ORAL | Status: DC

## 2019-06-13 MED ORDER — INSULIN ASPART 100 UNIT/ML ~~LOC~~ SOLN
0.0000 [IU] | Freq: Three times a day (TID) | SUBCUTANEOUS | Status: DC
  Administered 2019-06-13: 3 [IU] via SUBCUTANEOUS
  Administered 2019-06-13: 2 [IU] via SUBCUTANEOUS
  Administered 2019-06-14: 3 [IU] via SUBCUTANEOUS

## 2019-06-13 MED ORDER — IPRATROPIUM-ALBUTEROL 0.5-2.5 (3) MG/3ML IN SOLN
3.0000 mL | Freq: Four times a day (QID) | RESPIRATORY_TRACT | Status: DC
  Administered 2019-06-13: 3 mL via RESPIRATORY_TRACT
  Filled 2019-06-13: qty 3

## 2019-06-13 MED ORDER — DIPHENHYDRAMINE HCL 25 MG PO CAPS: 25.0000 mg | ORAL_CAPSULE | Freq: Four times a day (QID) | ORAL | Status: DC | PRN

## 2019-06-13 MED ORDER — ENOXAPARIN SODIUM 40 MG/0.4ML ~~LOC~~ SOLN
40.0000 mg | Freq: Every day | SUBCUTANEOUS | Status: DC
  Administered 2019-06-13 – 2019-06-14 (×2): 40 mg via SUBCUTANEOUS
  Filled 2019-06-13 (×2): qty 0.4

## 2019-06-13 MED ORDER — DEXTROSE-NACL 5-0.45 % IV SOLN
INTRAVENOUS | Status: DC
  Administered 2019-06-13: 01:00:00 via INTRAVENOUS

## 2019-06-13 MED ORDER — FAMOTIDINE IN NACL 20-0.9 MG/50ML-% IV SOLN
20.0000 mg | Freq: Two times a day (BID) | INTRAVENOUS | Status: DC
  Administered 2019-06-13 – 2019-06-14 (×3): 20 mg via INTRAVENOUS
  Filled 2019-06-13 (×3): qty 50

## 2019-06-13 NOTE — Plan of Care (Signed)
  Problem: Education: Goal: Knowledge of General Education information will improve Description Including pain rating scale, medication(s)/side effects and non-pharmacologic comfort measures Outcome: Progressing   

## 2019-06-13 NOTE — Plan of Care (Signed)
  Problem: Clinical Measurements: Goal: Respiratory complications will improve Outcome: Progressing   
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
## 2019-06-13 NOTE — Progress Notes (Signed)
New Admission Note: ? Arrival Method: Wheelchair Mental Orientation: Alert and Oriented X 4 Telemetry: Box [id number] Assessment: Completed Skin: Refer to flowsheet IV: Right hand Pain: 0 on a scale of 0-10 Tubes: None Safety Measures: Safety Fall Prevention Plan discussed with patient. Admission: Completed 5 [place] Orientation: Patient has been orientated to the room, unit and the staff. Family: None at Bedside Orders have been reviewed and are being implemented. Will continue to monitor the patient. Call light has been placed within reach and bed alarm has been activated.  ? Milagros Loll), RN  Phone Number: [phone]

## 2019-06-14 DIAGNOSIS — D72829 Elevated white blood cell count, unspecified: Secondary | ICD-10-CM

## 2019-06-14 DIAGNOSIS — R739 Hyperglycemia, unspecified: Secondary | ICD-10-CM

## 2019-06-14 DIAGNOSIS — R061 Stridor: Secondary | ICD-10-CM

## 2019-06-14 LAB — GLUCOSE, CAPILLARY
Glucose-Capillary: 224 mg/dL — ABNORMAL HIGH (ref 70–99)
Glucose-Capillary: 94 mg/dL (ref 70–99)

## 2019-06-14 MED ORDER — ACETAMINOPHEN 325 MG PO TABS: 650.0000 mg | ORAL_TABLET | Freq: Four times a day (QID) | ORAL | Status: AC | PRN

## 2019-06-14 MED ORDER — EPINEPHRINE 0.3 MG/0.3ML IJ SOAJ: 0.3000 mg | INTRAMUSCULAR | 0 refills | Status: AC | PRN

## 2019-06-14 MED ORDER — DIPHENHYDRAMINE HCL 25 MG PO CAPS: 25.0000 mg | ORAL_CAPSULE | Freq: Every day | ORAL | 0 refills | Status: DC

## 2019-06-14 MED ORDER — FAMOTIDINE 20 MG PO TABS: 20.0000 mg | ORAL_TABLET | Freq: Every day | ORAL | 0 refills | Status: DC

## 2019-06-14 MED ORDER — PREDNISONE 20 MG PO TABS: 60.0000 mg | ORAL_TABLET | Freq: Every day | ORAL | 0 refills | Status: AC

## 2019-06-14 MED ORDER — DIPHENHYDRAMINE HCL 25 MG PO CAPS
25.0000 mg | ORAL_CAPSULE | Freq: Three times a day (TID) | ORAL | Status: DC
  Administered 2019-06-14: 25 mg via ORAL
  Filled 2019-06-14: qty 1

## 2019-06-14 NOTE — Progress Notes (Signed)
DISCHARGE NOTE HOME [facility] to be discharged Home per MD order. Discussed prescriptions and follow up appointments with the patient. Prescriptions given to patient; medication list explained in detail. Patient verbalized understanding.  Skin clean, dry and intact without evidence of skin break down, no evidence of skin tears noted. IV catheter discontinued intact. Site without signs and symptoms of complications. Dressing and pressure applied. Pt denies pain at the site currently. No complaints noted.  Patient free of lines, drains, and wounds.   An After Visit Summary (AVS) was printed and given to the patient. Patient escorted via wheelchair, and discharged home via private auto.  Orville Govern, RN

## 2019-06-14 NOTE — Discharge Summary (Signed)
Physician Discharge Summary  Shawna Rodgers:[id number] DOB: 08-30-1961 DOA: 06/12/2019  PCP: Beaulah Corin, [facility]  Admit date: 06/12/2019 Discharge date: 06/14/2019  Time spent: 60 minutes minutes  Recommendations for Outpatient Follow-up:  1. Follow-up with Dr. Melissa Rodgers, ENT on 06/15/2019 at 8 AM for further evaluation of stridor, hoarseness, concern for subglottic etiology. 2. Follow-up with Pa, [facility] in 2 weeks.  On follow-up patient will need a basic metabolic profile done to follow-up on electrolytes and renal function.  Patient will need a thyroid ultrasound done to follow-up on thyroid nodule noted on CT soft tissue neck.  Patient also need a repeat noncontrasted CT chest done in about 12 months to follow-up on a 4 mm pulmonary nodule and may need outpatient referral to pulmonary.   Discharge Diagnoses:  Principal Problem:   Expiratory stridor Active Problems:   Hyperglycemia   Leucocytosis   Hives   Stridor   Discharge Condition: Stable and improved.  Diet recommendation: Heart healthy  Filed Weights   06/12/19 2031 06/13/19 0010 06/13/19 2045  Weight: 91.2 kg 94.8 kg 94.8 kg    History of present illness:  Per Dr. [name] is a 58 y.o. female with medical history significant of asthma, chronic bronchitis and tobacco abuse with recent cessation who had been having recurrent hives for a while.  Also has had recurrent shortness of breath on and off.  Patient quit smoking a few days ago.  Her current symptoms started a couple of weeks ago.  She felt shortness of breath with swelling on her face uvula which again happened this past few days.  She was seen in the ER on June 28 was evaluated and given breathing treatments as well as steroids.  Symptoms have improved.  She completed prednisone.  When the last few days symptoms have come back again she started noticing the rashes her face was swollen and then now having hoarseness in her voice as well as difficulty  breathing with stridor.  In the ER patient was noted to have hives all over her skin with the stridor.  No fever or chills.  Patient has chronically had NSAIDS of different type.  She is currently on naproxen.  Otherwise no other medications.  Denied any known allergen that she may be exposed to.  She continues to have hoarseness at the moment but no compromise breathing.  We are admitting her with possible allergic reactions with stridor.  Possible angioedema also..  ED Course: Temperature is 100.1 blood pressure 168/109 pulse 94, respiratory rate of 26 oxygen sat 95% on room air.  White count is 15.9 otherwise CBC and chemistry appeared to be within normal.  Glucose is elevated at 173.  COVID-19 testing so far negative. CT neck showed 1 cm cystic lesion along the midline of the subglottic airway.  This is indeterminate and could be a small vocal cord of polyp.  Recommended ENT referral.  Also have a 19 mm right thyroid nodules which is indeterminate.  Another 4 mm left upper lobe nodule.  Chest x-ray so far negative.  Patient being admitted for work-up.  COVID-19 test pending.  Hospital Course:  1 stridor and hoarseness of voice Questionable etiology.  Concern for subglottic etiology as CT soft tissue neck did show a 1 cm cystic lesion along the midline of the subglottic airway.  CT also did show a right thyroid nodule which was indeterminate and a left upper lobe nodule.  Patient was given EpiPen on presentation to  the ED and admitted and placed on IV steroids, IV Pepcid and supportive care.  Concern for also an allergic reaction.  Patient noted to have been on NSAIDs prior to admission which was subsequently discontinued and will not be resumed on discharge.  Patient improved clinically with no significant compromising airway.  Patient was speaking in full sentences by day of discharge however with some hoarseness.  Patient's hives had resolved.  Patient was tolerating oral intake.  Due to the recurrent  nature of patient's symptoms as well as CT soft tissue neck findings case was discussed with Dr. Janace Hoard, ENT who had recommended outpatient follow-up with evaluation on 06/15/2019 at 8 AM in his office.  Benadryl was added to patient's regimen.  Patient be discharged on prednisone 60 mg daily for the next 5 days, Benadryl and Pepcid taper.  Patient will also be discharged with an EpiPen.  Patient to follow-up with ENT in the outpatient setting.  2.  Hyperglycemia Likely secondary to steroids.  Patient was placed on a carb modified diet as well as sliding scale insulin.  Hemoglobin A1c was 5.4.  Outpatient follow-up.  3.  Leukocytosis Likely steroid-induced.  Patient with no signs or symptoms of infection.  Outpatient follow-up.  4.  Tobacco abuse Patient with recent tobacco cessation.  5.  History of asthma or bronchitis Stable.  Patient was placed on nebs as needed.  6.  19 mm right thyroid nodule Thyroid ultrasound recommended on a nonemergent basis in the outpatient setting.  7.  4 mm left upper lobe nodule Noncontrast chest CT may be considered in 12 months.  Outpatient follow-up with PCP.  Procedures:  CT soft tissue neck 06/12/2019  Chest x-ray 06/12/2019  Consultations:  Curb sided ENT, Dr. Janace Hoard 06/14/2019  Discharge Exam: Vitals:   06/14/19 0750 06/14/19 0933  BP:  (!) 139/95  Pulse: 85 80  Resp: 18 18  Temp:  98.6 F (37 C)  SpO2: 98% 96%    General: NAD.  Hoarseness. Cardiovascular: Regular rate rhythm no murmurs rubs or gallops. Respiratory: Clear to auscultation bilaterally.  Fair air movement.  Hoarse.  Speaking in full sentences.  Discharge Instructions   Discharge Instructions    Diet - low sodium heart healthy   Complete by: As directed    Increase activity slowly   Complete by: As directed      Allergies as of 06/14/2019      Reactions   Bee Venom Hives, Rash      Medication List    STOP taking these medications   aspirin EC 325 MG tablet    ibuprofen 800 MG tablet Commonly known as: ADVIL   naproxen sodium 220 MG tablet Commonly known as: ALEVE     TAKE these medications   acetaminophen 325 MG tablet Commonly known as: TYLENOL Take 2 tablets (650 mg total) by mouth every 6 (six) hours as needed for moderate pain.   albuterol 108 (90 Base) MCG/ACT inhaler Commonly known as: VENTOLIN HFA Inhale 1-2 puffs into the lungs every 6 (six) hours as needed for wheezing or shortness of breath.   diphenhydrAMINE 25 mg capsule Commonly known as: Benadryl Allergy Take 1-2 capsules (25-50 mg total) by mouth daily. Take 1 tablet 2 times daily x3 days, then 1 tablet daily x3 days, then stop.   EPINEPHrine 0.3 mg/0.3 mL Soaj injection Commonly known as: EPI-PEN Inject 0.3 mLs (0.3 mg total) into the muscle as needed for anaphylaxis.   famotidine 20 MG tablet Commonly known as:  PEPCID Take 1-2 tablets (20-40 mg total) by mouth daily. Take 1 tablets (20MG ) 2 times daily x3 days, then 1 tablet daily.   predniSONE 20 MG tablet Commonly known as: DELTASONE Take 3 tablets (60 mg total) by mouth daily with breakfast for 5 days. What changed: when to take this   triamcinolone lotion 0.1 % Commonly known as: KENALOG Apply 1 application topically 3 (three) times daily.      Allergies  Allergen Reactions  . Bee Venom Hives and Rash   Follow-up Information    Shawna Montane, MD Follow up on 06/15/2019.   Specialty: Otolaryngology Why: Follow-up with Dr. Janace Hoard, ENT office at 8 AM for further evaluation. Contact information: [address] [place] [address] [phone]        Pa, [facility]. Schedule an appointment as soon as possible for a visit in 2 week(s).   Specialty: Internal Medicine Contact information: [address] [place] [address] [phone]            The results of significant diagnostics from this hospitalization (including imaging, microbiology, ancillary and laboratory) are  listed below for reference.    Significant Diagnostic Studies: Dg Chest 2 View  Result Date: 06/04/2019 CLINICAL DATA:  Pt reports central chest tightness and throat area pain with sob x 1 week, hx of bronchitis. Slight wheezing during exam. Hx of asthma. Per pt SOB and chest tightness getting worse with no reilef with inhaler. EXAM: CHEST - 2 VIEW COMPARISON:  02/17/2016 FINDINGS: Heart and mediastinal contours are within normal limits. No focal opacities or effusions. No acute bony abnormality. IMPRESSION: No active cardiopulmonary disease. Electronically Signed   By: Rolm Baptise M.D.   On: 06/04/2019 23:35   Ct Soft Tissue Neck W Contrast  Result Date: 06/13/2019 CLINICAL DATA:  Initial evaluation for acute sore throat, stridor. EXAM: CT NECK WITH CONTRAST TECHNIQUE: Multidetector CT imaging of the neck was performed using the standard protocol following the bolus administration of intravenous contrast. CONTRAST:  76mL OMNIPAQUE IOHEXOL 300 MG/ML  SOLN COMPARISON:  None. FINDINGS: Pharynx and larynx: Oral cavity within normal limits without mass lesion or loculated fluid collection. Scattered dental caries noted about the remaining dentition without acute abnormality. Palatine tonsils symmetric and mildly prominent but within normal limits. No evidence for acute tonsillitis. Punctate calcified tonsillith noted on the right. Parapharyngeal fat maintained. Nasopharynx within normal limits. Epiglottis normal. Vallecula clear. No retropharyngeal fluid collection. Remainder of the hypopharynx and supraglottic larynx within normal limits. Glottis is opposed and not well assessed, but grossly symmetric. There is a 1 cm well-circumscribed ovoid hypodense cystic appearing lesion seen along the midline of the subglottic airway (series 7, image 51), indeterminate. This appears to arise from and extend inferiorly from the true cords superiorly, possibly from the right focal cord/fold (series 3, image 57). Remainder  of the subglottic airway otherwise clear. Salivary glands: Salivary glands including the parotid and submandibular glands are within normal limits. Thyroid: Approximate 19 mm nodule at the upper pole of the right thyroid, indeterminate. Thyroid otherwise unremarkable. Lymph nodes: No pathologically enlarged lymph nodes identified within the neck. Vascular: Normal intravascular enhancement seen throughout the neck. Limited intracranial: Unremarkable. Visualized orbits: Not included on this exam. Mastoids and visualized paranasal sinuses: Mild mucosal thickening within the visualized maxillary sinuses. Small right sphenoid sinus retention cyst noted. Visualized mastoids and middle ear cavities are well pneumatized and free of fluid. Skeleton: No acute osseous finding. No discrete lytic or blastic osseous lesions.  Moderate cervical spondylolysis at C3-4 through C6-7. Upper chest: Visualized upper chest demonstrates no acute finding. Visualized lungs are largely clear. 4 mm nodule present within the peripheral left upper lobe (series 6, image 29). Other: None. IMPRESSION: 1. 1 cm cystic lesion along the midline of the subglottic airway, indeterminate, but could reflect a small vocal cord/fold cyst and/or polyp. ENT referral for further workup and possible direct visualization recommended. 2. No other acute abnormality identified within the neck. 3. Approximate 19 mm right thyroid nodule, indeterminate. Further evaluation with dedicated thyroid ultrasound recommended. This could be performed on a nonemergent basis. 4. 4 mm left upper lobe nodule, indeterminate. No follow-up needed if patient is low-risk. Non-contrast chest CT can be considered in 12 months if patient is high-risk. This recommendation follows the consensus statement: Guidelines for Management of Incidental Pulmonary Nodules Detected on CT Images: From the Fleischner Society 2017; Radiology 2017; 284:228-243. Electronically Signed   By: Jeannine Boga M.D.   On: 06/13/2019 00:06   Dg Chest Portable 1 View  Result Date: 06/12/2019 CLINICAL DATA:  Shortness of breath EXAM: PORTABLE CHEST 1 VIEW COMPARISON:  June 04, 2019 FINDINGS: Lungs are clear. Heart size and pulmonary vascularity are normal. No adenopathy. No bone lesions. IMPRESSION: No edema or consolidation. Electronically Signed   By: Lowella Grip III M.D.   On: 06/12/2019 21:29    Microbiology: Recent Results (from the past 240 hour(s))  Novel Coronavirus, NAA ([facility])     Status: None   Collection Time: 06/07/19 12:00 AM  Result Value Ref Range Status   SARS-CoV-2, NAA Not Detected Not Detected Final    Comment: This test was developed and its performance characteristics determined by [employer]. This test has not been FDA cleared or approved. This test has been authorized by FDA under an Emergency Use Authorization (EUA). This test is only authorized for the duration of time the declaration that circumstances exist justifying the authorization of the emergency use of in vitro diagnostic tests for detection of SARS-CoV-2 virus and/or diagnosis of COVID-19 infection under section 564(b)(1) of the Act, 21 U.S.C. 062BJS-2(G)(3), unless the authorization is terminated or revoked sooner. When diagnostic testing is negative, the possibility of a false negative result should be considered in the context of a patient's recent exposures and the presence of clinical signs and symptoms consistent with COVID-19. An individual without symptoms of COVID-19 and who is not shedding SARS-CoV-2 virus would expect to have a negative (not detected) result in this assay.   SARS Coronavirus 2 (CEPHEID - Performed in [place] hospital lab), Hosp Order     Status: None   Collection Time: 06/12/19 11:42 PM   Specimen: Nasopharyngeal Swab  Result Value Ref Range Status   SARS Coronavirus 2 NEGATIVE NEGATIVE Final    Comment: (NOTE) If result is NEGATIVE SARS-CoV-2  target nucleic acids are NOT DETECTED. The SARS-CoV-2 RNA is generally detectable in upper and lower  respiratory specimens during the acute phase of infection. The lowest  concentration of SARS-CoV-2 viral copies this assay can detect is 250  copies / mL. A negative result does not preclude SARS-CoV-2 infection  and should not be used as the sole basis for treatment or other  patient management decisions.  A negative result may occur with  improper specimen collection / handling, submission of specimen other  than nasopharyngeal swab, presence of viral mutation(s) within the  areas targeted by this assay, and inadequate number of viral copies  (<250 copies / mL).  A negative result must be combined with clinical  observations, patient history, and epidemiological information. If result is POSITIVE SARS-CoV-2 target nucleic acids are DETECTED. The SARS-CoV-2 RNA is generally detectable in upper and lower  respiratory specimens dur ing the acute phase of infection.  Positive  results are indicative of active infection with SARS-CoV-2.  Clinical  correlation with patient history and other diagnostic information is  necessary to determine patient infection status.  Positive results do  not rule out bacterial infection or co-infection with other viruses. If result is PRESUMPTIVE POSTIVE SARS-CoV-2 nucleic acids MAY BE PRESENT.   A presumptive positive result was obtained on the submitted specimen  and confirmed on repeat testing.  While 2019 novel coronavirus  (SARS-CoV-2) nucleic acids may be present in the submitted sample  additional confirmatory testing may be necessary for epidemiological  and / or clinical management purposes  to differentiate between  SARS-CoV-2 and other Sarbecovirus currently known to infect humans.  If clinically indicated additional testing with an alternate test  methodology [phone]) is advised. The SARS-CoV-2 RNA is generally  detectable in upper and lower  respiratory sp ecimens during the acute  phase of infection. The expected result is Negative. Fact Sheet for Patients:  StrictlyIdeas.no Fact Sheet for Healthcare Providers: BankingDealers.co.za This test is not yet approved or cleared by the [place] FDA and has been authorized for detection and/or diagnosis of SARS-CoV-2 by FDA under an Emergency Use Authorization (EUA).  This EUA will remain in effect (meaning this test can be used) for the duration of the COVID-19 declaration under Section 564(b)(1) of the Act, 21 U.S.C. section 360bbb-3(b)(1), unless the authorization is terminated or revoked sooner. Performed at [facility], [place] [address] [place], [place] [address]      Labs: Basic Metabolic Panel: Recent Labs  Lab 06/12/19 2055 06/13/19 0059  NA 137 137  K 3.6 3.9  CL 101 99  CO2 24 29  GLUCOSE 173* 190*  BUN 15 11  CREATININE 0.95 0.83  CALCIUM 8.8* 9.2   Liver Function Tests: No results for input(s): AST, ALT, ALKPHOS, BILITOT, PROT, ALBUMIN in the last 168 hours. No results for input(s): LIPASE, AMYLASE in the last 168 hours. No results for input(s): AMMONIA in the last 168 hours. CBC: Recent Labs  Lab 06/12/19 2055 06/13/19 0059  WBC 15.9* 16.1*  HGB 13.7 13.9  HCT 42.8 42.9  MCV 97.7 95.3  PLT 257 264   Cardiac Enzymes: No results for input(s): CKTOTAL, CKMB, CKMBINDEX, TROPONINI in the last 168 hours. BNP: BNP (last 3 results) No results for input(s): BNP in the last 8760 hours.  ProBNP (last 3 results) No results for input(s): PROBNP in the last 8760 hours.  CBG: Recent Labs  Lab 06/13/19 1355 06/13/19 1645 06/14/19 0744 06/14/19 1126  GLUCAP 240* 173* 224* 94       Signed:  Irine Seal MD.  [facility] 06/14/2019, 2:46 PM

## 2019-06-16 ENCOUNTER — Encounter (HOSPITAL_COMMUNITY): Payer: Self-pay | Admitting: *Deleted

## 2019-06-16 ENCOUNTER — Other Ambulatory Visit: Payer: Self-pay

## 2019-06-16 ENCOUNTER — Other Ambulatory Visit: Payer: Self-pay | Admitting: Otolaryngology

## 2019-06-16 NOTE — Anesthesia Preprocedure Evaluation (Addendum)
Anesthesia Evaluation  Patient identified by MRN, date of birth, ID band Patient awake    Reviewed: Allergy & Precautions, NPO status , Patient's Chart, lab work & pertinent test results  Airway Mallampati: II  TM Distance: >3 FB Neck ROM: Full    Dental  (+) Edentulous Upper, Teeth Intact, Dental Advisory Given,    Pulmonary shortness of breath, asthma , COPD, Current Smoker,     + decreased breath sounds      Cardiovascular negative cardio ROS   Rhythm:Regular Rate:Normal     Neuro/Psych negative neurological ROS  negative psych ROS   GI/Hepatic negative GI ROS, Neg liver ROS,   Endo/Other  negative endocrine ROS  Renal/GU negative Renal ROS  negative genitourinary   Musculoskeletal negative musculoskeletal ROS (+)   Abdominal   Peds  Hematology negative hematology ROS (+)   Anesthesia Other Findings Vocal cord polyps  Reproductive/Obstetrics                          Anesthesia Physical Anesthesia Plan  ASA: II  Anesthesia Plan: General   Post-op Pain Management:    Induction: Intravenous  PONV Risk Score and Plan: 2 and Midazolam, Ondansetron and Dexamethasone  Airway Management Planned: Oral ETT  Additional Equipment: None  Intra-op Plan:   Post-operative Plan: Extubation in OR  Informed Consent: I have reviewed the patients History and Physical, chart, labs and discussed the procedure including the risks, benefits and alternatives for the proposed anesthesia with the patient or authorized representative who has indicated his/her understanding and acceptance.     Dental advisory given  Plan Discussed with: CRNA  Anesthesia Plan Comments: (Pt has been hospitalized twice with stridor and treated with steroids and epinephrine, most recently 7/6-06/14/19 during which Dr. Janace Hoard was consulted. CT showed a cystic mass within her subglottic region that appeared to be attached to  the true vocal cords. She was seen in outpt followup by Dr. Janace Hoard on 7/9, laryngoscopy performed, results below.   Laryngoscopy by Dr. Janace Hoard 06/15/19: Findings: The nasal cavity and nasopharynx are unremarkable. The tongue base, pharyngeal walls, piriform sinuses, vallecula, epiglottis and postcricoid region are normal in appearance. Immediately the visual inspection reveals that the glottis has some thickening of both vocal cords. The left vocal cord is worse than the right and there is a large ball valving polyp that is moving in and out of her glottis. It appears to be attached to the right anterior vocal cord. It has an irregular appearance. The vocal cords appear to move normally. )      Anesthesia Quick Evaluation

## 2019-06-16 NOTE — Progress Notes (Signed)
Pt denies SOB, chest pain, and being under the care of a cardiologist. Pt denies having a stress test, echo and cardiac cath.  Pt made aware to stop taking Aspirin (unless otherwise advised by surgeon), vitamins, fish oil and herbal medications. Do not take any NSAIDs ie: Ibuprofen, Advil, Naproxen (Aleve), Motrin, BC and Goody Powder.  Coronavirus Screening  Pt denies that she and family members experienced the following symptoms:  Cough yes/no: No Fever (>100.66F)  yes/no: No Runny nose yes/no: No Sore throat yes/no: No Difficulty breathing/shortness of breath  yes/no: No  Have you or a family member traveled in the last 14 days and where? yes/no: No  Pt reminded that hospital visitation restrictions are in effect and the importance of the restrictions.   Pt verbalized understanding of all pre-op instructions.  PA, Anesthesiology, asked to review pt history; see note.

## 2019-06-17 ENCOUNTER — Other Ambulatory Visit (HOSPITAL_COMMUNITY)
Admission: RE | Admit: 2019-06-17 | Discharge: 2019-06-17 | Disposition: A | Discharge status: Home / Self Care | Payer: HRSA Program | Source: Ambulatory Visit | Attending: Otolaryngology | Admitting: Otolaryngology

## 2019-06-17 DIAGNOSIS — Z1159 Encounter for screening for other viral diseases: Secondary | ICD-10-CM | POA: Insufficient documentation

## 2019-06-17 DIAGNOSIS — Z01812 Encounter for preprocedural laboratory examination: Secondary | ICD-10-CM | POA: Diagnosis present

## 2019-06-17 LAB — SARS CORONAVIRUS 2 (TAT 6-24 HRS): SARS Coronavirus 2: NEGATIVE

## 2019-06-19 ENCOUNTER — Other Ambulatory Visit: Payer: Self-pay

## 2019-06-19 ENCOUNTER — Ambulatory Visit (HOSPITAL_COMMUNITY): Payer: Self-pay | Admitting: Physician Assistant

## 2019-06-19 ENCOUNTER — Encounter (HOSPITAL_COMMUNITY): Payer: Self-pay | Admitting: *Deleted

## 2019-06-19 ENCOUNTER — Encounter (HOSPITAL_COMMUNITY)
Admission: RE | Disposition: A | Discharge status: Home / Self Care | Payer: Self-pay | Source: Ambulatory Visit | Attending: Otolaryngology

## 2019-06-19 ENCOUNTER — Ambulatory Visit (HOSPITAL_COMMUNITY)
Admission: RE | Admit: 2019-06-19 | Discharge: 2019-06-19 | Disposition: A | Discharge status: Home / Self Care | Payer: Self-pay | Source: Ambulatory Visit | Attending: Otolaryngology | Admitting: Otolaryngology

## 2019-06-19 DIAGNOSIS — J383 Other diseases of vocal cords: Secondary | ICD-10-CM | POA: Insufficient documentation

## 2019-06-19 DIAGNOSIS — Z79899 Other long term (current) drug therapy: Secondary | ICD-10-CM | POA: Insufficient documentation

## 2019-06-19 DIAGNOSIS — Z91030 Bee allergy status: Secondary | ICD-10-CM | POA: Insufficient documentation

## 2019-06-19 DIAGNOSIS — J449 Chronic obstructive pulmonary disease, unspecified: Secondary | ICD-10-CM | POA: Insufficient documentation

## 2019-06-19 DIAGNOSIS — J381 Polyp of vocal cord and larynx: Secondary | ICD-10-CM | POA: Insufficient documentation

## 2019-06-19 DIAGNOSIS — F17210 Nicotine dependence, cigarettes, uncomplicated: Secondary | ICD-10-CM | POA: Insufficient documentation

## 2019-06-19 HISTORY — DX: Other diseases of vocal cords: J38.3

## 2019-06-19 HISTORY — PX: MICROLARYNGOSCOPY: SHX5208

## 2019-06-19 HISTORY — DX: Presence of spectacles and contact lenses: Z97.3

## 2019-06-19 SURGERY — MICROLARYNGOSCOPY
Anesthesia: General | Site: Mouth | Laterality: Right

## 2019-06-19 MED ORDER — 0.9 % SODIUM CHLORIDE (POUR BTL) OPTIME
TOPICAL | Status: DC | PRN
  Administered 2019-06-19: 1000 mL

## 2019-06-19 MED ORDER — LIDOCAINE 2% (20 MG/ML) 5 ML SYRINGE
INTRAMUSCULAR | Status: DC | PRN
  Administered 2019-06-19: 80 mg via INTRAVENOUS

## 2019-06-19 MED ORDER — ALBUTEROL SULFATE HFA 108 (90 BASE) MCG/ACT IN AERS
INHALATION_SPRAY | RESPIRATORY_TRACT | Status: AC
  Filled 2019-06-19: qty 6.7

## 2019-06-19 MED ORDER — FENTANYL CITRATE (PF) 250 MCG/5ML IJ SOLN
INTRAMUSCULAR | Status: AC
  Filled 2019-06-19: qty 5

## 2019-06-19 MED ORDER — FENTANYL CITRATE (PF) 100 MCG/2ML IJ SOLN
INTRAMUSCULAR | Status: DC | PRN
  Administered 2019-06-19: 100 ug via INTRAVENOUS

## 2019-06-19 MED ORDER — ROCURONIUM BROMIDE 10 MG/ML (PF) SYRINGE
PREFILLED_SYRINGE | INTRAVENOUS | Status: AC
  Filled 2019-06-19: qty 10

## 2019-06-19 MED ORDER — ALBUTEROL SULFATE HFA 108 (90 BASE) MCG/ACT IN AERS
INHALATION_SPRAY | RESPIRATORY_TRACT | Status: DC | PRN
  Administered 2019-06-19: 6 via RESPIRATORY_TRACT

## 2019-06-19 MED ORDER — DEXAMETHASONE SODIUM PHOSPHATE 10 MG/ML IJ SOLN
INTRAMUSCULAR | Status: DC | PRN
  Administered 2019-06-19: 10 mg via INTRAVENOUS

## 2019-06-19 MED ORDER — HYDRALAZINE HCL 20 MG/ML IJ SOLN
INTRAMUSCULAR | Status: AC
  Filled 2019-06-19: qty 1

## 2019-06-19 MED ORDER — LIDOCAINE 2% (20 MG/ML) 5 ML SYRINGE
INTRAMUSCULAR | Status: AC
  Filled 2019-06-19: qty 5

## 2019-06-19 MED ORDER — SUCCINYLCHOLINE CHLORIDE 200 MG/10ML IV SOSY
PREFILLED_SYRINGE | INTRAVENOUS | Status: DC | PRN
  Administered 2019-06-19: 200 mg via INTRAVENOUS

## 2019-06-19 MED ORDER — ROCURONIUM BROMIDE 10 MG/ML (PF) SYRINGE
PREFILLED_SYRINGE | INTRAVENOUS | Status: DC | PRN
  Administered 2019-06-19: 30 mg via INTRAVENOUS

## 2019-06-19 MED ORDER — SUCCINYLCHOLINE CHLORIDE 200 MG/10ML IV SOSY
PREFILLED_SYRINGE | INTRAVENOUS | Status: AC
  Filled 2019-06-19: qty 10

## 2019-06-19 MED ORDER — OXYMETAZOLINE HCL 0.05 % NA SOLN
NASAL | Status: AC
  Filled 2019-06-19: qty 30

## 2019-06-19 MED ORDER — PROPOFOL 10 MG/ML IV BOLUS
INTRAVENOUS | Status: AC
  Filled 2019-06-19: qty 20

## 2019-06-19 MED ORDER — ACETAMINOPHEN 500 MG PO TABS
1000.0000 mg | ORAL_TABLET | Freq: Once | ORAL | Status: AC
  Administered 2019-06-19: 1000 mg via ORAL
  Filled 2019-06-19: qty 2

## 2019-06-19 MED ORDER — ONDANSETRON HCL 4 MG/2ML IJ SOLN
INTRAMUSCULAR | Status: AC
  Filled 2019-06-19: qty 2

## 2019-06-19 MED ORDER — LACTATED RINGERS IV SOLN
INTRAVENOUS | Status: DC | PRN
  Administered 2019-06-19: 07:00:00 via INTRAVENOUS

## 2019-06-19 MED ORDER — ONDANSETRON HCL 4 MG/2ML IJ SOLN
INTRAMUSCULAR | Status: DC | PRN
  Administered 2019-06-19: 4 mg via INTRAVENOUS

## 2019-06-19 MED ORDER — PROPOFOL 10 MG/ML IV BOLUS
INTRAVENOUS | Status: DC | PRN
  Administered 2019-06-19: 150 mg via INTRAVENOUS

## 2019-06-19 MED ORDER — OXYMETAZOLINE HCL 0.05 % NA SOLN
NASAL | Status: DC | PRN
  Administered 2019-06-19: 1

## 2019-06-19 MED ORDER — HYDRALAZINE HCL 20 MG/ML IJ SOLN
5.0000 mg | INTRAMUSCULAR | Status: DC | PRN
  Administered 2019-06-19 (×2): 5 mg via INTRAVENOUS

## 2019-06-19 MED ORDER — FENTANYL CITRATE (PF) 100 MCG/2ML IJ SOLN: 25.0000 ug | INTRAMUSCULAR | Status: DC | PRN

## 2019-06-19 MED ORDER — MIDAZOLAM HCL 2 MG/2ML IJ SOLN
INTRAMUSCULAR | Status: AC
  Filled 2019-06-19: qty 2

## 2019-06-19 MED ORDER — DEXAMETHASONE SODIUM PHOSPHATE 10 MG/ML IJ SOLN
INTRAMUSCULAR | Status: AC
  Filled 2019-06-19: qty 1

## 2019-06-19 MED ORDER — MIDAZOLAM HCL 5 MG/5ML IJ SOLN
INTRAMUSCULAR | Status: DC | PRN
  Administered 2019-06-19: 2 mg via INTRAVENOUS

## 2019-06-19 SURGICAL SUPPLY — 32 items
BLADE SURG 10 STRL SS (BLADE) IMPLANT
BLADE SURG 15 STRL LF DISP TIS (BLADE) IMPLANT
BLADE SURG 15 STRL SS (BLADE)
CANISTER SUCT 3000ML PPV (MISCELLANEOUS) ×3 IMPLANT
CONT SPEC 4OZ CLIKSEAL STRL BL (MISCELLANEOUS) ×6 IMPLANT
COVER BACK TABLE 60X90IN (DRAPES) ×3 IMPLANT
COVER MAYO STAND STRL (DRAPES) ×3 IMPLANT
COVER WAND RF STERILE (DRAPES) IMPLANT
DRAPE HALF SHEET 40X57 (DRAPES) ×3 IMPLANT
ELECT REM PT RETURN 9FT ADLT (ELECTROSURGICAL) ×3
ELECTRODE REM PT RTRN 9FT ADLT (ELECTROSURGICAL) ×1 IMPLANT
GAUZE SPONGE 4X4 12PLY STRL (GAUZE/BANDAGES/DRESSINGS) ×3 IMPLANT
GLOVE SS BIOGEL STRL SZ 7.5 (GLOVE) ×1 IMPLANT
GLOVE SUPERSENSE BIOGEL SZ 7.5 (GLOVE) ×2
GUARD TEETH (MISCELLANEOUS) IMPLANT
KIT BASIN OR (CUSTOM PROCEDURE TRAY) ×3 IMPLANT
KIT TURNOVER KIT B (KITS) ×3 IMPLANT
MARKER SKIN DUAL TIP RULER LAB (MISCELLANEOUS) IMPLANT
NEEDLE HYPO 25GX1X1/2 BEV (NEEDLE) IMPLANT
NS IRRIG 1000ML POUR BTL (IV SOLUTION) ×3 IMPLANT
PAD ARMBOARD 7.5X6 YLW CONV (MISCELLANEOUS) ×3 IMPLANT
PATTIES SURGICAL .5 X1 (DISPOSABLE) ×3 IMPLANT
PATTIES SURGICAL .5 X3 (DISPOSABLE) ×3 IMPLANT
PENCIL BUTTON HOLSTER BLD 10FT (ELECTRODE) ×3 IMPLANT
POSITIONER HEAD DONUT 9IN (MISCELLANEOUS) ×3 IMPLANT
SOLUTION ANTI FOG 6CC (MISCELLANEOUS) ×3 IMPLANT
SPECIMEN JAR SMALL (MISCELLANEOUS) ×3 IMPLANT
SYR 3ML LL SCALE MARK (SYRINGE) ×3 IMPLANT
TOWEL GREEN STERILE FF (TOWEL DISPOSABLE) ×3 IMPLANT
TUBE CONNECTING 12'X1/4 (SUCTIONS) ×1
TUBE CONNECTING 12X1/4 (SUCTIONS) ×2 IMPLANT
WATER STERILE IRR 1000ML POUR (IV SOLUTION) ×3 IMPLANT

## 2019-06-19 NOTE — Op Note (Signed)
Preop/postop diagnosis: Laryngeal mass Procedure: Microlaryngoscopy with excision of mass in the anterior commissure and vocal cord stripping bilaterally. Anesthesia: General Estimated blood loss: Approximately 10 cc Indications: 58 year old with a longstanding mass and difficulty breathing that has worsened over the last several months.  She is been admitted at least twice with respiratory distress.  In the office a fiberoptic exam revealed a bivalving large mass that was moving in and out of the glottis.  CT had indicated a cystic mass in the subglottis.  She was informed the risk and benefits of the procedure and options were discussed all questions were answered and consent was obtained. Procedure: Patient was taken the operating placed supine position after general endotracheal tube anesthesia with a #6 tube the Dedo scope was inserted and the larynx was examined.  The vocal cords were both very polypoid.  There was a large exophytic appearing mass that was pedicled on a stalk that extended to the anterior commissure.  This was grasped with the cup forceps and removed with the straight scissors.  It immediately started bleeding.  The anterior commissure had a lot of thickened tissue and some of it was friable.  A pledget was placed.  The left vocal cord was very polypoid and stripped with the cup forcep.  The right side was performed in a similar fashion.  There was still bleeding along the right vocal cord and anterior commissure that still had a friable tissue.  A cautery was used with the suction and that controlled the bleeding.  Pledgets were placed again.  There was good hemostasis.  The subglottis did not appear to have any other lesions.  The pathology definitely included the subglottic anteriorlybut the mass tissue was now removed and the anterior wall of the subglottisd could be seen. Marland Kitchen  She was then awakened brought to recovery in stable condition counts correct

## 2019-06-19 NOTE — Anesthesia Procedure Notes (Signed)
Procedure Name: Intubation Date/Time: 06/19/2019 7:45 AM Performed by: Moshe Salisbury, CRNA Pre-anesthesia Checklist: Patient identified, Emergency Drugs available, Suction available and Patient being monitored Patient Re-evaluated:Patient Re-evaluated prior to induction Oxygen Delivery Method: Circle System Utilized Preoxygenation: Pre-oxygenation with 100% oxygen Induction Type: IV induction and Rapid sequence Laryngoscope Size: Glidescope and 4 Tube type: Oral Tube size: 6.0 mm Number of attempts: 1 Airway Equipment and Method: Rigid stylet and Video-laryngoscopy Placement Confirmation: ETT inserted through vocal cords under direct vision,  positive ETCO2 and breath sounds checked- equal and bilateral Secured at: 19 cm Tube secured with: Tape Dental Injury: Teeth and Oropharynx as per pre-operative assessment

## 2019-06-19 NOTE — Anesthesia Postprocedure Evaluation (Addendum)
Anesthesia Post Note  Patient: Shawna Rodgers  Procedure(s) Performed: MICROLARYNGOSCOPY WITH EXCISION OF VOCAL CORD POLYPS (Right Mouth)     Patient location during evaluation: PACU Anesthesia Type: General Level of consciousness: awake and alert Pain management: pain level controlled Vital Signs Assessment: post-procedure vital signs reviewed and stable Respiratory status: spontaneous breathing, nonlabored ventilation, respiratory function stable and patient connected to nasal cannula oxygen Cardiovascular status: blood pressure returned to baseline and stable Postop Assessment: no apparent nausea or vomiting Anesthetic complications: no    Last Vitals:  Vitals:   06/19/19 0959 06/19/19 1012  BP: (!) 175/102 (!) 154/89  Pulse: 86 90  Resp: 20   Temp:  (!) 36.2 C  SpO2: 98% 98%    Last Pain:  Vitals:   06/19/19 1011  TempSrc:   PainSc: 0-No pain                 Shawna Rodgers

## 2019-06-19 NOTE — Discharge Instructions (Signed)

## 2019-06-19 NOTE — H&P (Signed)
Shawna Rodgers is an 58 y.o. female.   Chief Complaint: vocal cord mass HPI: hx of stridor and admission for breathing issues. Polyp on TV identified  Past Medical History:  Diagnosis Date  . Anemia   . Asthma   . Chronic bronchitis ([place])   . History of blood transfusion 08/07/2013   "first one I've ever had was today" (08/07/2013)  . Vocal cord mass   . Wears glasses     Past Surgical History:  Procedure Laterality Date  . CYSTOSCOPY N/A 05/24/2014   Procedure: CYSTOSCOPY;  Surgeon: Lavonia Drafts, MD;  Location: [facility];  Service: Gynecology;  Laterality: N/A;  . I&D EXTREMITY Left 08/16/2015   Procedure: IRRIGATION AND DEBRIDEMENT LEFT ANKLE AND EXPLORATION;  Surgeon: Leandrew Koyanagi, MD;  Location: [facility];  Service: Orthopedics;  Laterality: Left;  . ROBOTIC ASSISTED TOTAL HYSTERECTOMY N/A 05/24/2014   Procedure: ATTEMPTED ROBOTIC ASSISTED TOTAL HYSTERECTOMY ;  Surgeon: Lavonia Drafts, MD;  Location: [facility];  Service: Gynecology;  Laterality: N/A;  wound class contaminated  . TENDON REPAIR Left 08/16/2015   Procedure: TENDON REPAIR;  Surgeon: Leandrew Koyanagi, MD;  Location: [facility];  Service: Orthopedics;  Laterality: Left;  Marland Kitchen VAGINAL HYSTERECTOMY N/A 05/24/2014   Procedure: HYSTERECTOMY VAGINAL,;  Surgeon: Lavonia Drafts, MD;  Location: [facility];  Service: Gynecology;  Laterality: N/A;  converted to vaginal procedure at 0926    Family History  Problem Relation Age of Onset  . Breast cancer Mother   . Diabetes Mellitus II Father   . Colon cancer Neg Hx    Social History:  reports that she has been smoking cigarettes. She has a 8.00 pack-year smoking history. She has never used smokeless tobacco. She reports current alcohol use. She reports that she does not use drugs.  Allergies:  Allergies  Allergen Reactions  . Bee Venom Hives and Rash    Medications Prior to Admission  Medication Sig Dispense Refill  . acetaminophen (TYLENOL)  325 MG tablet Take 2 tablets (650 mg total) by mouth every 6 (six) hours as needed for moderate pain.    Marland Kitchen albuterol (PROVENTIL HFA;VENTOLIN HFA) 108 (90 Base) MCG/ACT inhaler Inhale 1-2 puffs into the lungs every 6 (six) hours as needed for wheezing or shortness of breath. 1 Inhaler 0  . diphenhydrAMINE (BENADRYL ALLERGY) 25 mg capsule Take 1-2 capsules (25-50 mg total) by mouth daily. Take 1 tablet 2 times daily x3 days, then 1 tablet daily x3 days, then stop. 9 capsule 0  . EPINEPHrine 0.3 mg/0.3 mL IJ SOAJ injection Inject 0.3 mLs (0.3 mg total) into the muscle as needed for anaphylaxis. 1 each 0  . famotidine (PEPCID) 20 MG tablet Take 1-2 tablets (20-40 mg total) by mouth daily. Take 1 tablets (20MG ) 2 times daily x3 days, then 1 tablet daily. 60 tablet 0  . predniSONE (DELTASONE) 20 MG tablet Take 3 tablets (60 mg total) by mouth daily with breakfast for 5 days. 15 tablet 0  . triamcinolone lotion (KENALOG) 0.1 % Apply 1 application topically 3 (three) times daily. (Patient not taking: Reported on 08/03/2017) 60 mL 0    Results for orders placed or performed during the hospital encounter of 06/17/19 (from the past 48 hour(s))  SARS Coronavirus 2 (Performed in [facility] hospital lab)     Status: None   Collection Time: 06/17/19  9:00 AM   Specimen: Nasal Swab  Result Value Ref Range   SARS Coronavirus 2 NEGATIVE NEGATIVE  Comment: (NOTE) SARS-CoV-2 target nucleic acids are NOT DETECTED. The SARS-CoV-2 RNA is generally detectable in upper and lower respiratory specimens during the acute phase of infection. Negative results do not preclude SARS-CoV-2 infection, do not rule out co-infections with other pathogens, and should not be used as the sole basis for treatment or other patient management decisions. Negative results must be combined with clinical observations, patient history, and epidemiological information. The expected result is Negative. Fact Sheet for  Patients: SugarRoll.be Fact Sheet for Healthcare Providers: https://www.woods-mathews.com/ This test is not yet approved or cleared by the [place] FDA and  has been authorized for detection and/or diagnosis of SARS-CoV-2 by FDA under an Emergency Use Authorization (EUA). This EUA will remain  in effect (meaning this test can be used) for the duration of the COVID-19 declaration under Section 56 4(b)(1) of the Act, 21 U.S.C. section 360bbb-3(b)(1), unless the authorization is terminated or revoked sooner. Performed at [facility], [place] [address] [place],  [address]    No results found.  Review of Systems  Constitutional: Negative.   HENT: Negative.   Eyes: Negative.   Respiratory: Negative.   Cardiovascular: Negative.   Skin: Negative.     Blood pressure (!) 156/94, pulse 77, temperature 98.4 F (36.9 C), temperature source Oral, resp. rate 18, height 5\' 5"  (1.651 m), weight 94.8 kg, last menstrual period 02/12/2014, SpO2 100 %. Physical Exam  Constitutional: She appears well-developed and well-nourished.  HENT:  Head: Normocephalic.  Nose: Nose normal.  Mouth/Throat: Oropharynx is clear and moist.  Eyes: Pupils are equal, round, and reactive to light. Conjunctivae are normal.  Neck: Normal range of motion. Neck supple.  Cardiovascular: Normal rate.  Respiratory: Effort normal.  GI: Soft.  Musculoskeletal: Normal range of motion.     Assessment/Plan Vocal cord mass- discussed microlaryngoscopy and ready to proceed  Melissa Montane, MD 06/19/2019, 7:20 AM

## 2019-06-19 NOTE — Transfer of Care (Signed)
Immediate Anesthesia Transfer of Care Note  Patient: Shawna Rodgers  Procedure(s) Performed: MICROLARYNGOSCOPY WITH EXCISION OF VOCAL CORD POLYPS (Right Mouth)  Patient Location: PACU  Anesthesia Type:General  Level of Consciousness: awake and patient cooperative  Airway & Oxygen Therapy: Patient Spontanous Breathing and Patient connected to nasal cannula oxygen  Post-op Assessment: Report given to RN, Post -op Vital signs reviewed and stable and Patient moving all extremities  Post vital signs: Reviewed and stable  Last Vitals:  Vitals Value Taken Time  BP 185/114 06/19/19 0845  Temp    Pulse 83 06/19/19 0847  Resp 35 06/19/19 0847  SpO2 100 % 06/19/19 0847  Vitals shown include unvalidated device data.  Last Pain:  Vitals:   06/19/19 0622  TempSrc: Oral  PainSc:       Patients Stated Pain Goal: 5 (43/15/40 0867)  Complications: No apparent anesthesia complications

## 2019-06-20 ENCOUNTER — Encounter (HOSPITAL_COMMUNITY): Payer: Self-pay | Admitting: Otolaryngology

## 2019-07-10 ENCOUNTER — Inpatient Hospital Stay: Admission: RE | Admit: 2019-07-10 | Payer: Medicaid Other | Source: Ambulatory Visit

## 2019-10-19 ENCOUNTER — Other Ambulatory Visit (HOSPITAL_COMMUNITY): Payer: Self-pay | Admitting: *Deleted

## 2019-10-23 ENCOUNTER — Other Ambulatory Visit (HOSPITAL_COMMUNITY): Payer: Self-pay | Admitting: *Deleted

## 2019-10-23 DIAGNOSIS — Z1231 Encounter for screening mammogram for malignant neoplasm of breast: Secondary | ICD-10-CM

## 2019-11-08 ENCOUNTER — Encounter (HOSPITAL_COMMUNITY): Payer: Self-pay

## 2019-11-08 ENCOUNTER — Other Ambulatory Visit: Payer: Self-pay

## 2019-11-08 ENCOUNTER — Emergency Department (HOSPITAL_COMMUNITY): Payer: Self-pay

## 2019-11-08 ENCOUNTER — Emergency Department (HOSPITAL_COMMUNITY)
Admission: EM | Admit: 2019-11-08 | Discharge: 2019-11-09 | Disposition: A | Discharge status: Home / Self Care | Payer: Self-pay | Attending: Emergency Medicine | Admitting: Emergency Medicine

## 2019-11-08 DIAGNOSIS — F17210 Nicotine dependence, cigarettes, uncomplicated: Secondary | ICD-10-CM | POA: Insufficient documentation

## 2019-11-08 DIAGNOSIS — K5792 Diverticulitis of intestine, part unspecified, without perforation or abscess without bleeding: Secondary | ICD-10-CM | POA: Insufficient documentation

## 2019-11-08 LAB — URINALYSIS, ROUTINE W REFLEX MICROSCOPIC
Bilirubin Urine: NEGATIVE
Glucose, UA: NEGATIVE mg/dL
Ketones, ur: NEGATIVE mg/dL
Leukocytes,Ua: NEGATIVE
Nitrite: NEGATIVE
Protein, ur: NEGATIVE mg/dL
Specific Gravity, Urine: 1.024 (ref 1.005–1.030)
pH: 5 (ref 5.0–8.0)

## 2019-11-08 LAB — CBC
HCT: 41.1 % (ref 36.0–46.0)
Hemoglobin: 12.6 g/dL (ref 12.0–15.0)
MCH: 30.4 pg (ref 26.0–34.0)
MCHC: 30.7 g/dL (ref 30.0–36.0)
MCV: 99 fL (ref 80.0–100.0)
Platelets: 274 10*3/uL (ref 150–400)
RBC: 4.15 MIL/uL (ref 3.87–5.11)
RDW: 12.7 % (ref 11.5–15.5)
WBC: 8.2 10*3/uL (ref 4.0–10.5)
nRBC: 0 % (ref 0.0–0.2)

## 2019-11-08 LAB — COMPREHENSIVE METABOLIC PANEL
ALT: 20 U/L (ref 0–44)
AST: 18 U/L (ref 15–41)
Albumin: 4.1 g/dL (ref 3.5–5.0)
Alkaline Phosphatase: 66 U/L (ref 38–126)
Anion gap: 8 (ref 5–15)
BUN: 12 mg/dL (ref 6–20)
CO2: 26 mmol/L (ref 22–32)
Calcium: 9.3 mg/dL (ref 8.9–10.3)
Chloride: 105 mmol/L (ref 98–111)
Creatinine, Ser: 0.87 mg/dL (ref 0.44–1.00)
GFR calc Af Amer: >60 mL/min (ref >60)
GFR calc non Af Amer: >60 mL/min (ref >60)
Glucose, Bld: 133 mg/dL — ABNORMAL HIGH (ref 70–99)
Potassium: 3.8 mmol/L (ref 3.5–5.1)
Sodium: 139 mmol/L (ref 135–145)
Total Bilirubin: 0.7 mg/dL (ref 0.3–1.2)
Total Protein: 7.2 g/dL (ref 6.5–8.1)

## 2019-11-08 LAB — LIPASE, BLOOD: Lipase: 28 U/L (ref 11–51)

## 2019-11-08 MED ORDER — CIPROFLOXACIN IN D5W 400 MG/200ML IV SOLN
400.0000 mg | Freq: Once | INTRAVENOUS | Status: AC
  Administered 2019-11-09: 01:00:00 400 mg via INTRAVENOUS
  Filled 2019-11-08: qty 200

## 2019-11-08 MED ORDER — SODIUM CHLORIDE 0.9% FLUSH
3.0000 mL | Freq: Once | INTRAVENOUS | Status: AC
  Administered 2019-11-08: 23:00:00 3 mL via INTRAVENOUS

## 2019-11-08 MED ORDER — METRONIDAZOLE IN NACL 5-0.79 MG/ML-% IV SOLN
500.0000 mg | Freq: Once | INTRAVENOUS | Status: AC
  Administered 2019-11-09: 500 mg via INTRAVENOUS
  Filled 2019-11-08: qty 100

## 2019-11-08 MED ORDER — SODIUM CHLORIDE 0.9 % IV BOLUS
1000.0000 mL | Freq: Once | INTRAVENOUS | Status: AC
  Administered 2019-11-08: 1000 mL via INTRAVENOUS

## 2019-11-08 MED ORDER — ONDANSETRON 4 MG PO TBDP: ORAL_TABLET | ORAL | 0 refills | Status: DC

## 2019-11-08 MED ORDER — HYDROCODONE-ACETAMINOPHEN 5-325 MG PO TABS: 1.0000 | ORAL_TABLET | Freq: Four times a day (QID) | ORAL | 0 refills | Status: DC | PRN

## 2019-11-08 MED ORDER — CIPROFLOXACIN HCL 500 MG PO TABS: 500.0000 mg | ORAL_TABLET | Freq: Two times a day (BID) | ORAL | 0 refills | Status: DC

## 2019-11-08 MED ORDER — IOHEXOL 300 MG/ML  SOLN
100.0000 mL | Freq: Once | INTRAMUSCULAR | Status: AC | PRN
  Administered 2019-11-08: 23:00:00 100 mL via INTRAVENOUS

## 2019-11-08 MED ORDER — SODIUM CHLORIDE (PF) 0.9 % IJ SOLN
INTRAMUSCULAR | Status: AC
  Filled 2019-11-08: qty 50

## 2019-11-08 MED ORDER — METRONIDAZOLE 500 MG PO TABS: 500.0000 mg | ORAL_TABLET | Freq: Three times a day (TID) | ORAL | 0 refills | Status: DC

## 2019-11-08 NOTE — ED Provider Notes (Signed)
[facility] DEPT Provider Note   CSN: [id number] Arrival date & time: 11/08/19  1854     History   Chief Complaint Chief Complaint  Patient presents with   Abdominal Pain    HPI BRETT KITCH is a 58 y.o. female.     Patient complains of suprapubic abdominal pain and left lower quadrant abdominal pain.  Some vomiting  The history is provided by the patient. No language interpreter was used.  Abdominal Pain Pain location:  LLQ Pain quality: aching   Pain radiates to:  Does not radiate Pain severity:  Moderate Onset quality:  Sudden Timing:  Constant Progression:  Waxing and waning Chronicity:  New Context: not alcohol use   Associated symptoms: no chest pain, no cough, no diarrhea, no fatigue and no hematuria     Past Medical History:  Diagnosis Date   Anemia    Asthma    Chronic bronchitis ([place])    History of blood transfusion 08/07/2013   "first one I've ever had was today" (08/07/2013)   Vocal cord mass    Wears glasses     Patient Active Problem List   Diagnosis Date Noted   Stridor    Expiratory stridor 06/12/2019   Hyperglycemia 06/12/2019   Leucocytosis 06/12/2019   Hives 06/12/2019   Post-operative state 05/24/2014   Fibroids 02/09/2014   Menorrhagia 02/09/2014   UTI (lower urinary tract infection) 08/07/2013   Anemia 08/07/2013    Past Surgical History:  Procedure Laterality Date   CYSTOSCOPY N/A 05/24/2014   Procedure: CYSTOSCOPY;  Surgeon: Lavonia Drafts, MD;  Location: [facility];  Service: Gynecology;  Laterality: N/A;   I&D EXTREMITY Left 08/16/2015   Procedure: IRRIGATION AND DEBRIDEMENT LEFT ANKLE AND EXPLORATION;  Surgeon: Leandrew Koyanagi, MD;  Location: [facility];  Service: Orthopedics;  Laterality: Left;   MICROLARYNGOSCOPY Right 06/19/2019   Procedure: MICROLARYNGOSCOPY WITH EXCISION OF VOCAL CORD POLYPS;  Surgeon: Melissa Montane, MD;  Location: [facility];  Service: ENT;   Laterality: Right;   ROBOTIC ASSISTED TOTAL HYSTERECTOMY N/A 05/24/2014   Procedure: ATTEMPTED ROBOTIC ASSISTED TOTAL HYSTERECTOMY ;  Surgeon: Lavonia Drafts, MD;  Location: [facility];  Service: Gynecology;  Laterality: N/A;  wound class contaminated   TENDON REPAIR Left 08/16/2015   Procedure: TENDON REPAIR;  Surgeon: Leandrew Koyanagi, MD;  Location: [facility];  Service: Orthopedics;  Laterality: Left;   VAGINAL HYSTERECTOMY N/A 05/24/2014   Procedure: HYSTERECTOMY VAGINAL,;  Surgeon: Lavonia Drafts, MD;  Location: [facility];  Service: Gynecology;  Laterality: N/A;  converted to vaginal procedure at 0926     OB History    Gravida  4   Para  3   Term  3   Preterm      AB  1   Living  3     SAB  0   TAB  1   Ectopic  0   Multiple  0   Live Births               Home Medications    Prior to Admission medications   Medication Sig Start Date End Date Taking? Authorizing Provider  acetaminophen (TYLENOL) 325 MG tablet Take 2 tablets (650 mg total) by mouth every 6 (six) hours as needed for moderate pain. 06/14/19  Yes Eugenie Filler, MD  albuterol (PROVENTIL HFA;VENTOLIN HFA) 108 (90 Base) MCG/ACT inhaler Inhale 1-2 puffs into the lungs every 6 (six) hours as needed for wheezing or shortness  of breath. 02/17/16  Yes Sam, Serena Y, PA-C  EPINEPHrine 0.3 mg/0.3 mL IJ SOAJ injection Inject 0.3 mLs (0.3 mg total) into the muscle as needed for anaphylaxis. 06/14/19  Yes Eugenie Filler, MD  psyllium (METAMUCIL SMOOTH TEXTURE) 28 % packet Take 1 packet by mouth daily.   Yes [provider]  vitamin B-12 (CYANOCOBALAMIN) 1000 MCG tablet Take 1,000 mcg by mouth daily.   Yes [provider]  ciprofloxacin (CIPRO) 500 MG tablet Take 1 tablet (500 mg total) by mouth 2 (two) times daily. One po bid x 7 days 11/08/19   Milton Ferguson, MD  diphenhydrAMINE (BENADRYL ALLERGY) 25 mg capsule Take 1-2 capsules (25-50 mg total) by mouth daily. Take 1  tablet 2 times daily x3 days, then 1 tablet daily x3 days, then stop. Patient not taking: Reported on 11/08/2019 06/14/19   Eugenie Filler, MD  famotidine (PEPCID) 20 MG tablet Take 1-2 tablets (20-40 mg total) by mouth daily. Take 1 tablets (20MG ) 2 times daily x3 days, then 1 tablet daily. Patient not taking: Reported on 11/08/2019 06/14/19 06/13/20  Eugenie Filler, MD  HYDROcodone-acetaminophen (NORCO/VICODIN) 5-325 MG tablet Take 1 tablet by mouth every 6 (six) hours as needed for moderate pain. 11/08/19   Milton Ferguson, MD  metroNIDAZOLE (FLAGYL) 500 MG tablet Take 1 tablet (500 mg total) by mouth 3 (three) times daily. One po bid x 7 days 11/08/19   Milton Ferguson, MD  ondansetron (ZOFRAN ODT) 4 MG disintegrating tablet 4mg  ODT q4 hours prn nausea/vomit 11/08/19   Milton Ferguson, MD  triamcinolone lotion (KENALOG) 0.1 % Apply 1 application topically 3 (three) times daily. Patient not taking: Reported on 08/03/2017 02/17/16   Anne Ng, PA-C    Family History Family History  Problem Relation Age of Onset   Breast cancer Mother    Diabetes Mellitus II Father    Colon cancer Neg Hx     Social History Social History   Tobacco Use   Smoking status: Current Every Day Smoker    Packs/day: 0.25    Years: 32.00    Pack years: 8.00    Types: Cigarettes    Last attempt to quit: 02/14/2014    Years since quitting: 5.7   Smokeless tobacco: Never Used  Substance Use Topics   Alcohol use: Yes    Comment: wine during holidays   Drug use: No     Allergies   Bee venom   Review of Systems Review of Systems  Constitutional: Negative for appetite change and fatigue.  HENT: Negative for congestion, ear discharge and sinus pressure.   Eyes: Negative for discharge.  Respiratory: Negative for cough.   Cardiovascular: Negative for chest pain.  Gastrointestinal: Positive for abdominal pain. Negative for diarrhea.  Genitourinary: Negative for frequency and hematuria.    Musculoskeletal: Negative for back pain.  Skin: Negative for rash.  Neurological: Negative for seizures and headaches.  Psychiatric/Behavioral: Negative for hallucinations.     Physical Exam Updated Vital Signs BP (!) 145/95 (BP Location: Right Arm)    Pulse 74    Temp 99.1 F (37.3 C) (Oral)    Resp 18    Ht 5\' 6"  (1.676 m)    Wt 90.7 kg    LMP 02/12/2014    SpO2 100%    BMI 32.28 kg/m   Physical Exam Vitals signs and nursing note reviewed.  Constitutional:      Appearance: She is well-developed.  HENT:     Head: Normocephalic.  Nose: Nose normal.  Eyes:     General: No scleral icterus.    Conjunctiva/sclera: Conjunctivae normal.  Neck:     Musculoskeletal: Neck supple.     Thyroid: No thyromegaly.  Cardiovascular:     Rate and Rhythm: Normal rate and regular rhythm.     Heart sounds: No murmur. No friction rub. No gallop.   Pulmonary:     Breath sounds: No stridor. No wheezing or rales.  Chest:     Chest wall: No tenderness.  Abdominal:     General: There is no distension.     Tenderness: There is abdominal tenderness. There is no rebound.     Comments: Tender llq  Musculoskeletal: Normal range of motion.  Lymphadenopathy:     Cervical: No cervical adenopathy.  Skin:    Findings: No erythema or rash.  Neurological:     Mental Status: She is oriented to person, place, and time.     Motor: No abnormal muscle tone.     Coordination: Coordination normal.  Psychiatric:        Behavior: Behavior normal.      ED Treatments / Results  Labs (all labs ordered are listed, but only abnormal results are displayed) Labs Reviewed  COMPREHENSIVE METABOLIC PANEL - Abnormal; Notable for the following components:      Result Value   Glucose, Bld 133 (*)    All other components within normal limits  URINALYSIS, ROUTINE W REFLEX MICROSCOPIC - Abnormal; Notable for the following components:   APPearance HAZY (*)    Hgb urine dipstick MODERATE (*)    Bacteria, UA RARE  (*)    All other components within normal limits  LIPASE, BLOOD  CBC    EKG None  Radiology Ct Abdomen Pelvis W Contrast  Result Date: 11/08/2019 CLINICAL DATA:  Abdominal distension EXAM: CT ABDOMEN AND PELVIS WITH CONTRAST TECHNIQUE: Multidetector CT imaging of the abdomen and pelvis was performed using the standard protocol following bolus administration of intravenous contrast. CONTRAST:  147mL OMNIPAQUE IOHEXOL 300 MG/ML  SOLN COMPARISON:  August 07, 2013 FINDINGS: Lower chest: The visualized heart size within normal limits. No pericardial fluid/thickening. No hiatal hernia. Minimal bibasilar dependent atelectasis. Hepatobiliary: The liver is normal in density without focal abnormality.The main portal vein is patent. No evidence of calcified gallstones, gallbladder wall thickening or biliary dilatation. Pancreas: Unremarkable. No pancreatic ductal dilatation or surrounding inflammatory changes. Spleen: Normal in size without focal abnormality. Adrenals/Urinary Tract: Both adrenal glands appear normal. The kidneys and collecting system appear normal without evidence of urinary tract calculus or hydronephrosis. Bladder is unremarkable. Stomach/Bowel: The stomach and small bowel are normal in appearance. There is a 5 cm segment of sigmoid colon with diverticula and mild surrounding fat stranding changes. No pericolonic free air or loculated fluid collections are seen.The appendix is normal. Vascular/Lymphatic: There are no enlarged mesenteric, retroperitoneal, or pelvic lymph nodes. No significant vascular findings are present. Reproductive: The patient is status post hysterectomy. No adnexal masses or collections seen. Other: No evidence of abdominal wall mass or hernia. Musculoskeletal: No acute or significant osseous findings. IMPRESSION: Acute sigmoid colonic diverticulitis. No pericolonic free air or abscess. Electronically Signed   By: Prudencio Pair M.D.   On: 11/08/2019 23:15     Procedures Procedures (including critical care time)  Medications Ordered in ED Medications  sodium chloride (PF) 0.9 % injection (has no administration in time range)  metroNIDAZOLE (FLAGYL) IVPB 500 mg (has no administration in time range)  ciprofloxacin (CIPRO) IVPB  400 mg (has no administration in time range)  sodium chloride flush (NS) 0.9 % injection 3 mL (3 mLs Intravenous Given 11/08/19 2248)  sodium chloride 0.9 % bolus 1,000 mL (1,000 mLs Intravenous New Bag/Given 11/08/19 2256)  iohexol (OMNIPAQUE) 300 MG/ML solution 100 mL (100 mLs Intravenous Contrast Given 11/08/19 2258)     Initial Impression / Assessment and Plan / ED Course  I have reviewed the triage vital signs and the nursing notes.  Pertinent labs & imaging results that were available during my care of the patient were reviewed by me and considered in my medical decision making (see chart for details).        Patient has diverticulitis.  She appears nontoxic.  Patient will be sent home on Flagyl Cipro Zofran and hydrocodone and referred to GI Final Clinical Impressions(s) / ED Diagnoses   Final diagnoses:  Diverticulitis    ED Discharge Orders         Ordered    metroNIDAZOLE (FLAGYL) 500 MG tablet  3 times daily     11/08/19 2340    ciprofloxacin (CIPRO) 500 MG tablet  2 times daily     11/08/19 2340    ondansetron (ZOFRAN ODT) 4 MG disintegrating tablet     11/08/19 2340    HYDROcodone-acetaminophen (NORCO/VICODIN) 5-325 MG tablet  Every 6 hours PRN     11/08/19 2342           Milton Ferguson, MD 11/08/19 2347

## 2019-11-08 NOTE — Discharge Instructions (Signed)
Call tomorrow to make an appointment to get seen at [facility] gastroenterology.  Return to the emergency department if having severe pain or vomiting

## 2019-11-08 NOTE — ED Triage Notes (Signed)
Patient arrived stating she has had over the last year she has had intermittent abdominal pain, nausea and vomiting over the last year. States she has been treating herself with Gas-X. Reporting an episode of this over the last few days and was constipated, when able to have a bowel movement she noticed bright red blood in her stool. Reporting a history of hemorrhoids so is unsure if it that or something new. Last bm today.

## 2019-11-08 NOTE — ED Notes (Signed)
No answer from lobby  

## 2019-11-09 NOTE — ED Notes (Signed)
Pt ANTIBIOTICS GOING ONE AT AT TIME -

## 2019-11-16 ENCOUNTER — Ambulatory Visit (HOSPITAL_COMMUNITY): Payer: Medicaid Other

## 2019-12-15 ENCOUNTER — Ambulatory Visit: Payer: Medicaid Other | Admitting: Internal Medicine

## 2019-12-28 ENCOUNTER — Ambulatory Visit (HOSPITAL_COMMUNITY): Admission: RE | Admit: 2019-12-28 | Payer: Medicaid Other | Source: Ambulatory Visit

## 2019-12-28 ENCOUNTER — Encounter (HOSPITAL_COMMUNITY): Payer: Self-pay

## 2019-12-28 ENCOUNTER — Other Ambulatory Visit: Payer: Self-pay | Admitting: Internal Medicine

## 2019-12-28 ENCOUNTER — Other Ambulatory Visit: Payer: Self-pay

## 2019-12-28 DIAGNOSIS — Z1231 Encounter for screening mammogram for malignant neoplasm of breast: Secondary | ICD-10-CM

## 2020-01-15 ENCOUNTER — Ambulatory Visit: Payer: 59 | Admitting: Family Medicine

## 2020-01-26 ENCOUNTER — Other Ambulatory Visit: Payer: Self-pay

## 2020-01-26 ENCOUNTER — Ambulatory Visit
Admission: RE | Admit: 2020-01-26 | Discharge: 2020-01-26 | Disposition: A | Discharge status: Home / Self Care | Payer: 59 | Source: Ambulatory Visit | Attending: Internal Medicine | Admitting: Internal Medicine

## 2020-01-26 DIAGNOSIS — Z1231 Encounter for screening mammogram for malignant neoplasm of breast: Secondary | ICD-10-CM

## 2020-02-05 ENCOUNTER — Ambulatory Visit: Payer: 59 | Admitting: Family Medicine

## 2020-08-18 IMAGING — DX CHEST - 2 VIEW
2 series · 2 of 2 positions shown · non-contrast
Comparison: 02/17/2016

CLINICAL DATA: Pt reports central chest tightness and throat area
pain with sob x 1 week, hx of bronchitis. Slight wheezing during
exam. Hx of asthma. Per pt SOB and chest tightness getting worse
with no reilef with inhaler.

EXAM:
CHEST - 2 VIEW

[chest pa]
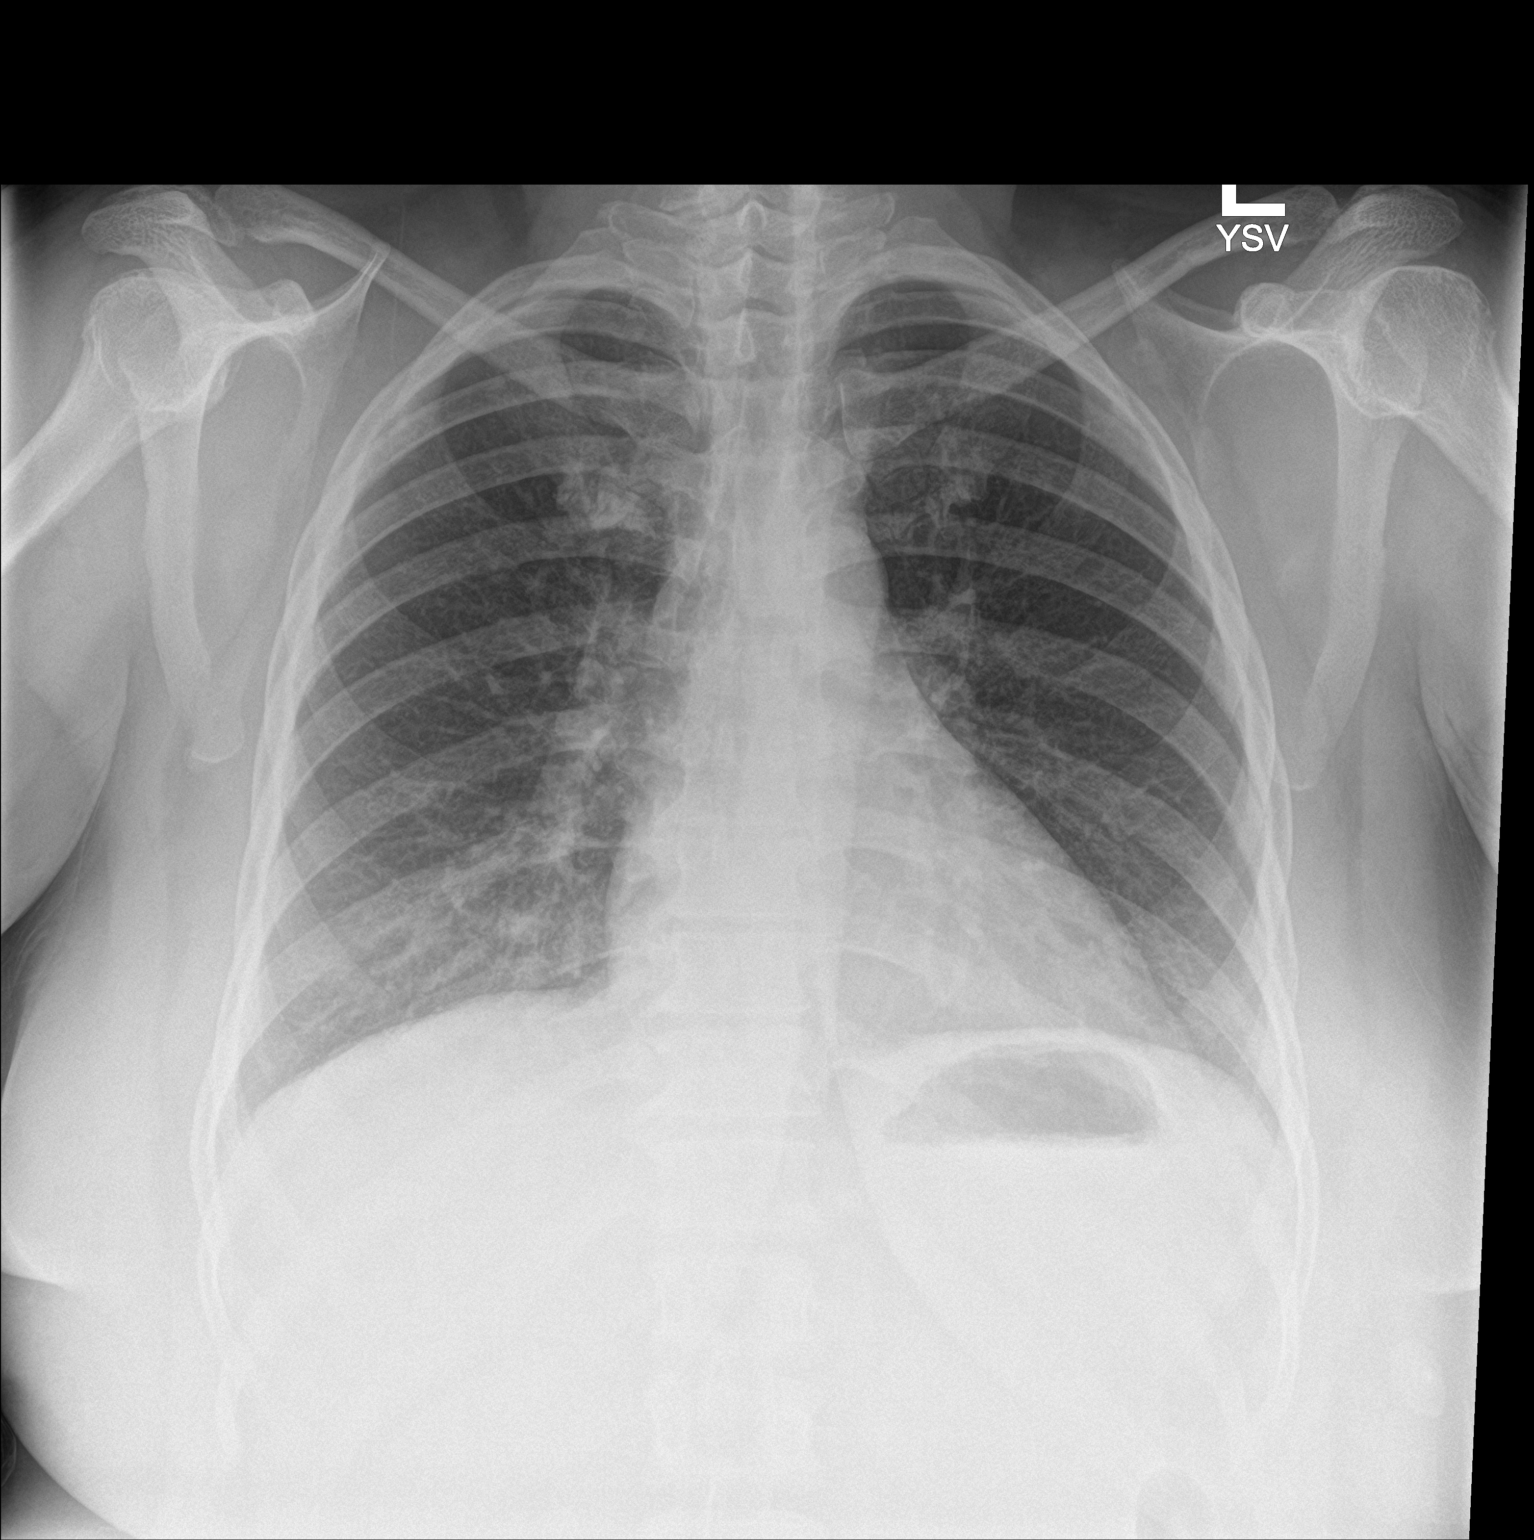

[chest lat]
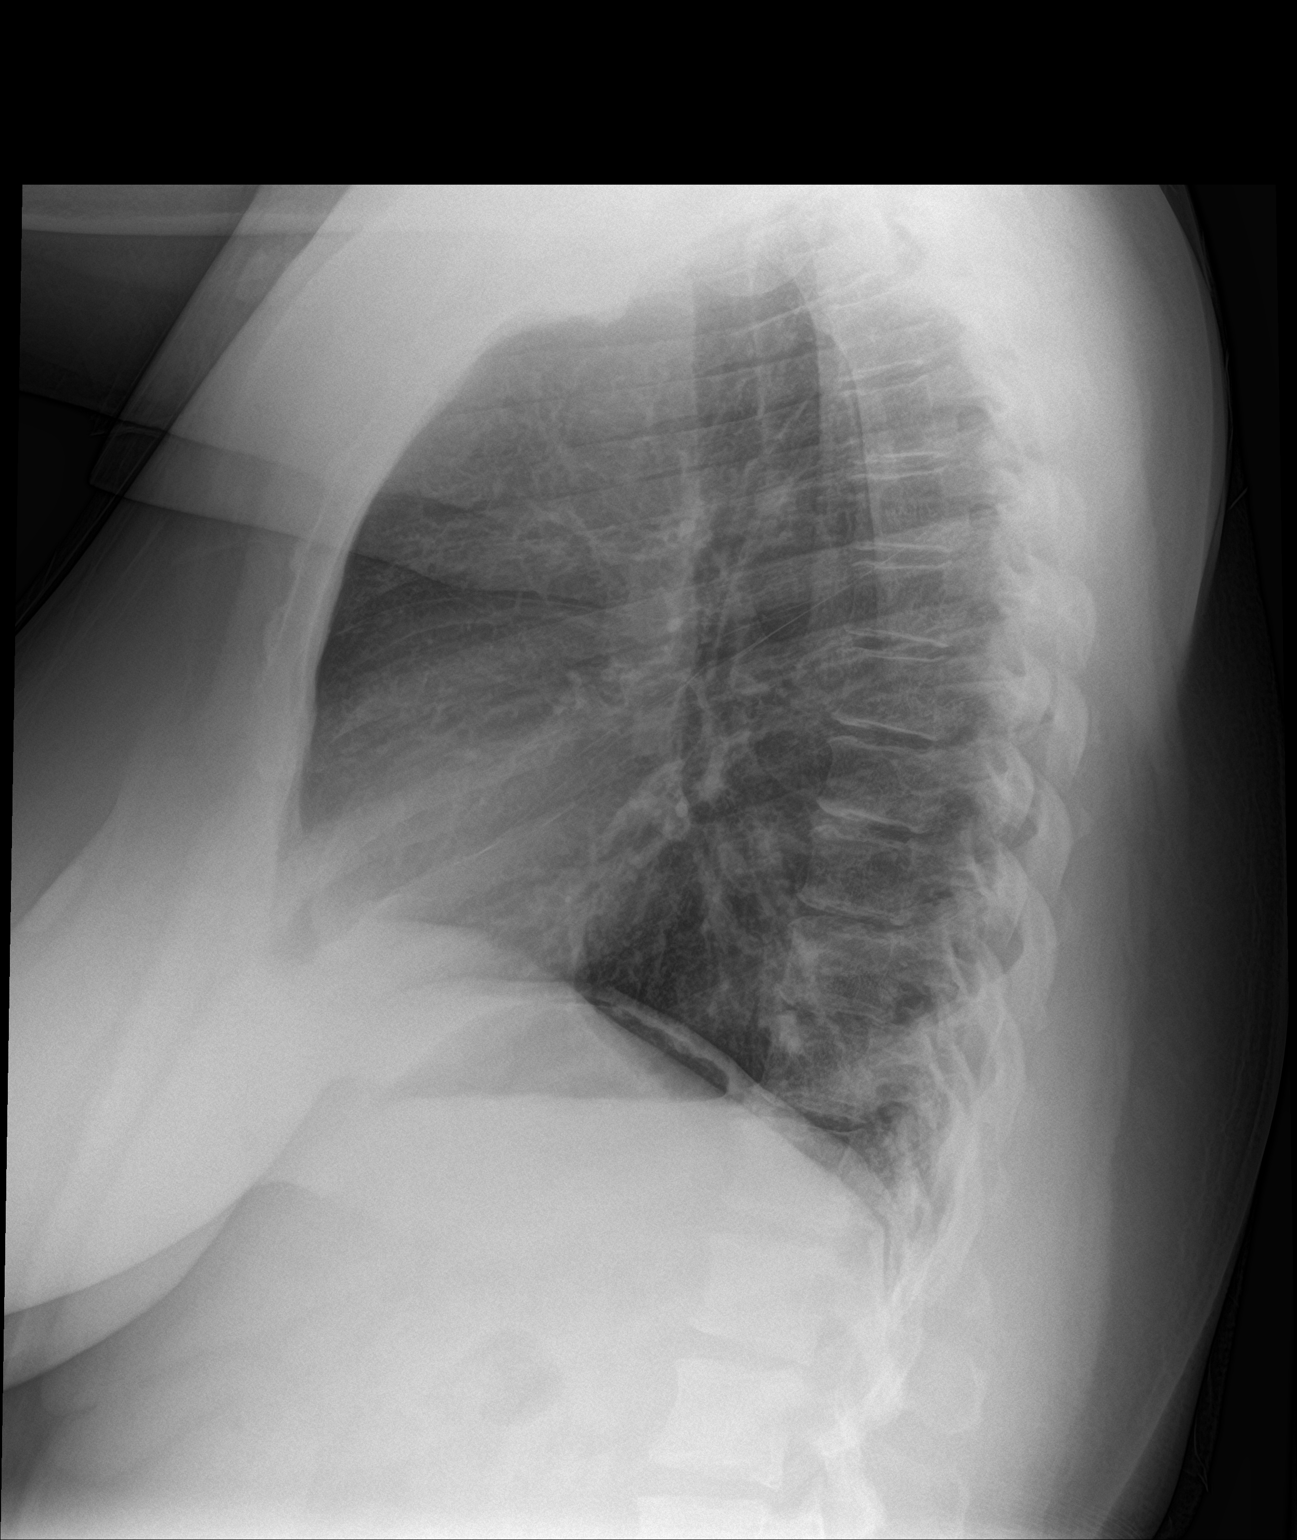

[2 of 2 positions shown; findings below may reference images not displayed]

FINDINGS: Heart and mediastinal contours are within normal limits. No focal
opacities or effusions. No acute bony abnormality.
IMPRESSION: No active cardiopulmonary disease.

## 2021-05-20 ENCOUNTER — Other Ambulatory Visit: Payer: Self-pay | Admitting: Internal Medicine

## 2021-11-18 ENCOUNTER — Other Ambulatory Visit: Payer: Self-pay | Admitting: Internal Medicine

## 2021-11-18 DIAGNOSIS — Z1231 Encounter for screening mammogram for malignant neoplasm of breast: Secondary | ICD-10-CM

## 2021-12-18 ENCOUNTER — Other Ambulatory Visit: Payer: Self-pay

## 2021-12-18 ENCOUNTER — Ambulatory Visit
Admission: RE | Admit: 2021-12-18 | Discharge: 2021-12-18 | Disposition: A | Discharge status: Home / Self Care | Payer: PRIVATE HEALTH INSURANCE | Source: Ambulatory Visit | Attending: Internal Medicine | Admitting: Internal Medicine

## 2021-12-18 DIAGNOSIS — Z1231 Encounter for screening mammogram for malignant neoplasm of breast: Secondary | ICD-10-CM

## 2022-04-16 ENCOUNTER — Ambulatory Visit (HOSPITAL_COMMUNITY)
Admission: EM | Admit: 2022-04-16 | Discharge: 2022-04-16 | Disposition: A | Discharge status: Home / Self Care | Payer: PRIVATE HEALTH INSURANCE | Attending: Family Medicine | Admitting: Family Medicine

## 2022-04-16 ENCOUNTER — Encounter (HOSPITAL_COMMUNITY): Payer: Self-pay

## 2022-04-16 DIAGNOSIS — R21 Rash and other nonspecific skin eruption: Secondary | ICD-10-CM

## 2022-04-16 DIAGNOSIS — R030 Elevated blood-pressure reading, without diagnosis of hypertension: Secondary | ICD-10-CM

## 2022-04-16 DIAGNOSIS — B353 Tinea pedis: Secondary | ICD-10-CM

## 2022-04-16 MED ORDER — CLOTRIMAZOLE-BETAMETHASONE 1-0.05 % EX CREA: TOPICAL_CREAM | CUTANEOUS | 0 refills | Status: DC

## 2022-04-16 MED ORDER — PREDNISONE 20 MG PO TABS: 40.0000 mg | ORAL_TABLET | Freq: Every day | ORAL | 0 refills | Status: DC

## 2022-04-16 NOTE — ED Triage Notes (Signed)
Last week Pt reports a blister in between her right great toe and 2nd. Pt popped the blister and now reports similar lesion on her right hand. Lesions are pruritic. Has been doing epsom salt soaks and otc meds and ointments w/o relief. ?

## 2022-04-16 NOTE — Discharge Instructions (Addendum)
You may also try soaking your foot in over the counter [place] solution. Use as directed on box. ? ?Your blood pressure was noted to be elevated during your visit today. If you are currently taking medication for high blood pressure, please ensure you are taking this as directed. If you do not have a history of high blood pressure and your blood pressure remains persistently elevated, you may need to begin taking a medication at some point. You may return here within the next few days to recheck if unable to see your primary care provider or if you do not have a one. ? ?BP (!) 169/103 (BP Location: Right Arm)   Pulse 84   Temp 98.2 ?F (36.8 ?C) (Oral)   Resp 18   LMP 02/12/2014   SpO2 94%  ? ?BP Readings from Last 3 Encounters:  ?04/16/22 (!) 169/103  ?11/09/19 (!) 153/89  ?06/19/19 (!) 154/89  ? ? ? ?

## 2022-04-21 NOTE — ED Provider Notes (Signed)
?[place] ? ? ?[id number] ?04/16/22 Arrival Time: 1847 ? ?ASSESSMENT & PLAN: ? ?1. Rash and nonspecific skin eruption   ?2. Tinea pedis of left foot   ?3. Elevated blood pressure reading without diagnosis of hypertension   ? ?No signs of bacterial skin infection. ? ?Meds ordered this encounter  ?Medications  ? clotrimazole-betamethasone (LOTRISONE) cream  ?  Sig: Apply to affected area 2 times daily for up to one week.  ?  Dispense:  45 g  ?  Refill:  0  ? predniSONE (DELTASONE) 20 MG tablet  ?  Sig: Take 2 tablets (40 mg total) by mouth daily.  ?  Dispense:  10 tablet  ?  Refill:  0  ? ? ? ?Discharge Instructions   ? ?  ?You may also try soaking your foot in over the counter [place] solution. Use as directed on box. ? ?Your blood pressure was noted to be elevated during your visit today. If you are currently taking medication for high blood pressure, please ensure you are taking this as directed. If you do not have a history of high blood pressure and your blood pressure remains persistently elevated, you may need to begin taking a medication at some point. You may return here within the next few days to recheck if unable to see your primary care provider or if you do not have a one. ? ?BP (!) 169/103 (BP Location: Right Arm)   Pulse 84   Temp 98.2 ?F (36.8 ?C) (Oral)   Resp 18   LMP 02/12/2014   SpO2 94%  ? ?BP Readings from Last 3 Encounters:  ?04/16/22 (!) 169/103  ?11/09/19 (!) 153/89  ?06/19/19 (!) 154/89  ? ? ? ? ? ? ? ? ?Will follow up with PCP or here if worsening or failing to improve as anticipated. ?Reviewed expectations re: course of current medical issues. Questions answered. ?Outlined signs and symptoms indicating need for more acute intervention. ?Patient verbalized understanding. ?After Visit Summary given. ? ? ?SUBJECTIVE: ? ?Shawna Rodgers is a 61 y.o. female who presents with a skin complaint. ?Maceration between great and second toes of L foot; on/off weeks; weepy at  times; minimal discomfort. Afebrile. ?No specific aggravating or alleviating factors reported. ? ?OBJECTIVE: ?Vitals:  ? 04/16/22 1911  ?BP: (!) 169/103  ?Pulse: 84  ?Resp: 18  ?Temp: 98.2 ?F (36.8 ?C)  ?TempSrc: Oral  ?SpO2: 94%  ?  ?General appearance: alert; no distress ?HEENT: [place]; AT ?Neck: supple with FROM ?CV: reg ?Extremities: no edema; moves all extremities normally ?Skin: warm and dry; macerated skin between great and second toe of LEFT foot consistent with tinea; other generalized non-specific rash over extremities that is somewhat rhus-like ?Psychological: alert and cooperative; normal mood and affect ? ?Allergies  ?Allergen Reactions  ? Bee Venom Hives and Rash  ? ? ?Past Medical History:  ?Diagnosis Date  ? Anemia   ? Asthma   ? Chronic bronchitis ([place])   ? History of blood transfusion 08/07/2013  ? "first one I've ever had was today" (08/07/2013)  ? Vocal cord mass   ? Wears glasses   ? ?Social History  ? ?Socioeconomic History  ? Marital status: Single  ?  Spouse name: Not on file  ? Number of children: Not on file  ? Years of education: Not on file  ? Highest education level: Not on file  ?Occupational History  ? Not on file  ?Tobacco Use  ? Smoking status: Every Day  ?  Packs/day: 0.25  ?  Years: 32.00  ?  Pack years: 8.00  ?  Types: Cigarettes  ?  Last attempt to quit: 02/14/2014  ?  Years since quitting: 8.1  ? Smokeless tobacco: Never  ?Vaping Use  ? Vaping Use: Never used  ?Substance and Sexual Activity  ? Alcohol use: Yes  ?  Comment: wine during holidays  ? Drug use: No  ? Sexual activity: Yes  ?  Birth control/protection: Surgical  ?Other Topics Concern  ? Not on file  ?Social History Narrative  ? Not on file  ? ?Social Determinants of Health  ? ?Financial Resource Strain: Not on file  ?Food Insecurity: Not on file  ?Transportation Needs: Not on file  ?Physical Activity: Not on file  ?Stress: Not on file  ?Social Connections: Not on file  ?Intimate Partner Violence: Not on file  ? ?Family History   ?Problem Relation Age of Onset  ? Breast cancer Mother   ? Diabetes Mellitus II Father   ? Colon cancer Neg Hx   ? ?Past Surgical History:  ?Procedure Laterality Date  ? CYSTOSCOPY N/A 05/24/2014  ? Procedure: CYSTOSCOPY;  Surgeon: Lavonia Drafts, MD;  Location: [facility];  Service: Gynecology;  Laterality: N/A;  ? I & D EXTREMITY Left 08/16/2015  ? Procedure: IRRIGATION AND DEBRIDEMENT LEFT ANKLE AND EXPLORATION;  Surgeon: Leandrew Koyanagi, MD;  Location: [facility];  Service: Orthopedics;  Laterality: Left;  ? MICROLARYNGOSCOPY Right 06/19/2019  ? Procedure: MICROLARYNGOSCOPY WITH EXCISION OF VOCAL CORD POLYPS;  Surgeon: Melissa Montane, MD;  Location: [facility];  Service: ENT;  Laterality: Right;  ? ROBOTIC ASSISTED TOTAL HYSTERECTOMY N/A 05/24/2014  ? Procedure: ATTEMPTED ROBOTIC ASSISTED TOTAL HYSTERECTOMY ;  Surgeon: Lavonia Drafts, MD;  Location: [facility];  Service: Gynecology;  Laterality: N/A;  wound class contaminated  ? TENDON REPAIR Left 08/16/2015  ? Procedure: TENDON REPAIR;  Surgeon: Leandrew Koyanagi, MD;  Location: [facility];  Service: Orthopedics;  Laterality: Left;  ? VAGINAL HYSTERECTOMY N/A 05/24/2014  ? Procedure: HYSTERECTOMY VAGINAL,;  Surgeon: Lavonia Drafts, MD;  Location: [facility];  Service: Gynecology;  Laterality: N/A;  converted to vaginal procedure at 0926  ? ? ?  ?Vanessa Kick, MD ?04/21/22 [phone] ? ?

## 2022-05-06 ENCOUNTER — Ambulatory Visit (INDEPENDENT_AMBULATORY_CARE_PROVIDER_SITE_OTHER): Payer: PRIVATE HEALTH INSURANCE | Admitting: Podiatry

## 2022-05-06 DIAGNOSIS — B353 Tinea pedis: Secondary | ICD-10-CM

## 2022-05-06 DIAGNOSIS — R252 Cramp and spasm: Secondary | ICD-10-CM

## 2022-05-06 DIAGNOSIS — Q666 Other congenital valgus deformities of feet: Secondary | ICD-10-CM | POA: Diagnosis not present

## 2022-05-06 MED ORDER — CYCLOBENZAPRINE HCL 10 MG PO TABS: 10.0000 mg | ORAL_TABLET | Freq: Three times a day (TID) | ORAL | 0 refills | Status: DC | PRN

## 2022-05-06 MED ORDER — CLOTRIMAZOLE-BETAMETHASONE 1-0.05 % EX CREA: TOPICAL_CREAM | CUTANEOUS | 0 refills | Status: DC

## 2022-05-06 MED ORDER — CLOTRIMAZOLE-BETAMETHASONE 1-0.05 % EX CREA: 1.0000 "application " | TOPICAL_CREAM | Freq: Two times a day (BID) | CUTANEOUS | 0 refills | Status: DC

## 2022-05-12 NOTE — Progress Notes (Signed)
Subjective:  Patient ID: Shawna Rodgers, female    DOB: [birth year],  MRN: [id number]  Chief Complaint  Patient presents with   Callouses    61 y.o. female presents with the above complaint.  Patient presents today with multiple complaints including muscle cramping that she gets to both of her legs.  She states been going for quite some time is progressive gotten worse.  She does not have any circulation issues she states her ankle spasms and sometimes gets locked up.  She states she also has secondary complaint of being flat-footed with abductor foot structure which could likely be leading to muscle cramping as well.  She does not wear any orthotics she wears regular shoes.  She has tertiary complaint of athlete's foot for which she is tried over-the-counter medication none of which has helped.  She gets a lot of itching and blistering with it.   Review of Systems: Negative except as noted in the HPI. Denies N/V/F/Ch.  Past Medical History:  Diagnosis Date   Anemia    Asthma    Chronic bronchitis ([place])    History of blood transfusion 08/07/2013   "first one I've ever had was today" (08/07/2013)   Vocal cord mass    Wears glasses     Current Outpatient Medications:    clotrimazole-betamethasone (LOTRISONE) cream, Apply 1 application. topically 2 (two) times daily., Disp: 30 g, Rfl: 0   cyclobenzaprine (FLEXERIL) 10 MG tablet, Take 1 tablet (10 mg total) by mouth 3 (three) times daily as needed for muscle spasms., Disp: 30 tablet, Rfl: 0   acetaminophen (TYLENOL) 325 MG tablet, Take 2 tablets (650 mg total) by mouth every 6 (six) hours as needed for moderate pain., Disp:  , Rfl:    albuterol (PROVENTIL HFA;VENTOLIN HFA) 108 (90 Base) MCG/ACT inhaler, Inhale 1-2 puffs into the lungs every 6 (six) hours as needed for wheezing or shortness of breath., Disp: 1 Inhaler, Rfl: 0   ciprofloxacin (CIPRO) 500 MG tablet, Take 1 tablet (500 mg total) by mouth 2 (two) times daily. One po bid x 7 days,  Disp: 14 tablet, Rfl: 0   clotrimazole-betamethasone (LOTRISONE) cream, Apply to affected area 2 times daily for up to one week., Disp: 45 g, Rfl: 0   diphenhydrAMINE (BENADRYL ALLERGY) 25 mg capsule, Take 1-2 capsules (25-50 mg total) by mouth daily. Take 1 tablet 2 times daily x3 days, then 1 tablet daily x3 days, then stop. (Patient not taking: Reported on 11/08/2019), Disp: 9 capsule, Rfl: 0   EPINEPHrine 0.3 mg/0.3 mL IJ SOAJ injection, Inject 0.3 mLs (0.3 mg total) into the muscle as needed for anaphylaxis., Disp: 1 each, Rfl: 0   famotidine (PEPCID) 20 MG tablet, Take 1-2 tablets (20-40 mg total) by mouth daily. Take 1 tablets ('20MG'$ ) 2 times daily x3 days, then 1 tablet daily. (Patient not taking: Reported on 11/08/2019), Disp: 60 tablet, Rfl: 0   HYDROcodone-acetaminophen (NORCO/VICODIN) 5-325 MG tablet, Take 1 tablet by mouth every 6 (six) hours as needed for moderate pain., Disp: 20 tablet, Rfl: 0   metroNIDAZOLE (FLAGYL) 500 MG tablet, Take 1 tablet (500 mg total) by mouth 3 (three) times daily. One po bid x 7 days, Disp: 21 tablet, Rfl: 0   ondansetron (ZOFRAN ODT) 4 MG disintegrating tablet, '4mg'$  ODT q4 hours prn nausea/vomit, Disp: 12 tablet, Rfl: 0   predniSONE (DELTASONE) 20 MG tablet, Take 2 tablets (40 mg total) by mouth daily., Disp: 10 tablet, Rfl: 0   psyllium (METAMUCIL SMOOTH TEXTURE)  28 % packet, Take 1 packet by mouth daily., Disp: , Rfl:    triamcinolone lotion (KENALOG) 0.1 %, Apply 1 application topically 3 (three) times daily. (Patient not taking: Reported on 08/03/2017), Disp: 60 mL, Rfl: 0   vitamin B-12 (CYANOCOBALAMIN) 1000 MCG tablet, Take 1,000 mcg by mouth daily., Disp: , Rfl:   Social History   Tobacco Use  Smoking Status Every Day   Packs/day: 0.25   Years: 32.00   Pack years: 8.00   Types: Cigarettes   Last attempt to quit: 02/14/2014   Years since quitting: 8.2  Smokeless Tobacco Never    Allergies  Allergen Reactions   Bee Venom Hives and Rash    Objective:  There were no vitals filed for this visit. There is no height or weight on file to calculate BMI. Constitutional Well developed. Well nourished.  Vascular Dorsalis pedis pulses palpable bilaterally. Posterior tibial pulses palpable bilaterally. Capillary refill normal to all digits.  No cyanosis or clubbing noted. Pedal hair growth normal.  Neurologic Normal speech. Oriented to person, place, and time. Epicritic sensation to light touch grossly present bilaterally.  Dermatologic Epidermal lysis noted to bilateral plantar foot with subjective component of itching noted.  No wounds or lesion noted  Orthopedic: Gait examination shows pes planovalgus foot structure with calcaneovalgus to many toe signs unable to recruit the arch with dorsiflexion of the hallux.  Unable to perform single double heel raise.  Subjective couple muscle cramping noted.   Radiographs: None Assessment:   1. Tinea pedis of both feet   2. Pes planovalgus   3. Muscle cramp    Plan:  Patient was evaluated and treated and all questions answered.  Pes planovalgus -I explained to patient the etiology of pes planovalgus and relationship with Planter fasciitis and various treatment options were discussed.  Given patient foot structure in the setting of Planter fasciitis I believe patient will benefit from custom-made orthotics to help control the hindfoot motion support the arch of the foot and take the stress away from plantar fascial.  Patient agrees with the plan like to proceed with orthotics -Patient was casted for orthotics   Muscle cramps -I explained to patient the etiology of muscle cramps and various treatment options were discussed.  Given the amount of cramping that is present I believe she will benefit from muscle relaxer.  Flexeril was sent to the pharmacy.   Athlete's foot -I explained the patient the etiology of athlete's foot and various treatment options were discussed.  Given the  amount of athlete's foot that is present she will benefit from Lotrisone cream.  Lotrisone cream was sent to the pharmacy.  I have asked her to apply twice a day.  She states understanding  No follow-ups on file.

## 2022-06-17 ENCOUNTER — Ambulatory Visit (INDEPENDENT_AMBULATORY_CARE_PROVIDER_SITE_OTHER): Payer: PRIVATE HEALTH INSURANCE | Admitting: Podiatry

## 2022-06-17 DIAGNOSIS — B353 Tinea pedis: Secondary | ICD-10-CM | POA: Diagnosis not present

## 2022-06-17 DIAGNOSIS — R252 Cramp and spasm: Secondary | ICD-10-CM | POA: Diagnosis not present

## 2022-06-17 DIAGNOSIS — Q666 Other congenital valgus deformities of feet: Secondary | ICD-10-CM

## 2022-06-24 NOTE — Progress Notes (Signed)
Subjective:  Patient ID: Shawna Rodgers, female    DOB: 1960-12-19,  MRN: [id number]  Chief Complaint  Patient presents with   Callouses    61 y.o. female presents with the above complaint.  Patient presents with follow-up of pes planovalgus deformity with Dr. Pura Spice pain and tinea pedis.  Patient states she is doing a lot better.  She is here to pick up orthotics she denies any other acute complaints or fungus has improved as well as some muscle cramping Flexeril.   Review of Systems: Negative except as noted in the HPI. Denies N/V/F/Ch.  Past Medical History:  Diagnosis Date   Anemia    Asthma    Chronic bronchitis ([place])    History of blood transfusion 08/07/2013   "first one I've ever had was today" (08/07/2013)   Vocal cord mass    Wears glasses     Current Outpatient Medications:    acetaminophen (TYLENOL) 325 MG tablet, Take 2 tablets (650 mg total) by mouth every 6 (six) hours as needed for moderate pain., Disp:  , Rfl:    albuterol (PROVENTIL HFA;VENTOLIN HFA) 108 (90 Base) MCG/ACT inhaler, Inhale 1-2 puffs into the lungs every 6 (six) hours as needed for wheezing or shortness of breath., Disp: 1 Inhaler, Rfl: 0   ciprofloxacin (CIPRO) 500 MG tablet, Take 1 tablet (500 mg total) by mouth 2 (two) times daily. One po bid x 7 days, Disp: 14 tablet, Rfl: 0   clotrimazole-betamethasone (LOTRISONE) cream, Apply 1 application. topically 2 (two) times daily., Disp: 30 g, Rfl: 0   clotrimazole-betamethasone (LOTRISONE) cream, Apply to affected area 2 times daily for up to one week., Disp: 45 g, Rfl: 0   cyclobenzaprine (FLEXERIL) 10 MG tablet, Take 1 tablet (10 mg total) by mouth 3 (three) times daily as needed for muscle spasms., Disp: 30 tablet, Rfl: 0   diphenhydrAMINE (BENADRYL ALLERGY) 25 mg capsule, Take 1-2 capsules (25-50 mg total) by mouth daily. Take 1 tablet 2 times daily x3 days, then 1 tablet daily x3 days, then stop. (Patient not taking: Reported on 11/08/2019), Disp: 9  capsule, Rfl: 0   EPINEPHrine 0.3 mg/0.3 mL IJ SOAJ injection, Inject 0.3 mLs (0.3 mg total) into the muscle as needed for anaphylaxis., Disp: 1 each, Rfl: 0   famotidine (PEPCID) 20 MG tablet, Take 1-2 tablets (20-40 mg total) by mouth daily. Take 1 tablets ('20MG'$ ) 2 times daily x3 days, then 1 tablet daily. (Patient not taking: Reported on 11/08/2019), Disp: 60 tablet, Rfl: 0   HYDROcodone-acetaminophen (NORCO/VICODIN) 5-325 MG tablet, Take 1 tablet by mouth every 6 (six) hours as needed for moderate pain., Disp: 20 tablet, Rfl: 0   metroNIDAZOLE (FLAGYL) 500 MG tablet, Take 1 tablet (500 mg total) by mouth 3 (three) times daily. One po bid x 7 days, Disp: 21 tablet, Rfl: 0   ondansetron (ZOFRAN ODT) 4 MG disintegrating tablet, '4mg'$  ODT q4 hours prn nausea/vomit, Disp: 12 tablet, Rfl: 0   predniSONE (DELTASONE) 20 MG tablet, Take 2 tablets (40 mg total) by mouth daily., Disp: 10 tablet, Rfl: 0   psyllium (METAMUCIL SMOOTH TEXTURE) 28 % packet, Take 1 packet by mouth daily., Disp: , Rfl:    triamcinolone lotion (KENALOG) 0.1 %, Apply 1 application topically 3 (three) times daily. (Patient not taking: Reported on 08/03/2017), Disp: 60 mL, Rfl: 0   vitamin B-12 (CYANOCOBALAMIN) 1000 MCG tablet, Take 1,000 mcg by mouth daily., Disp: , Rfl:   Social History   Tobacco Use  Smoking  Status Every Day   Packs/day: 0.25   Years: 32.00   Total pack years: 8.00   Types: Cigarettes   Last attempt to quit: 02/14/2014   Years since quitting: 8.3  Smokeless Tobacco Never    Allergies  Allergen Reactions   Bee Venom Hives and Rash   Objective:  There were no vitals filed for this visit. There is no height or weight on file to calculate BMI. Constitutional Well developed. Well nourished.  Vascular Dorsalis pedis pulses palpable bilaterally. Posterior tibial pulses palpable bilaterally. Capillary refill normal to all digits.  No cyanosis or clubbing noted. Pedal hair growth normal.  Neurologic Normal  speech. Oriented to person, place, and time. Epicritic sensation to light touch grossly present bilaterally.  Dermatologic Epidermal lysis noted to bilateral plantar foot with subjective component of itching noted.  No wounds or lesion noted  Orthopedic: Gait examination shows pes planovalgus foot structure with calcaneovalgus to many toe signs unable to recruit the arch with dorsiflexion of the hallux.  Unable to perform single double heel raise.  Subjective couple muscle cramping noted.   Radiographs: None Assessment:   1. Tinea pedis of both feet   2. Pes planovalgus   3. Muscle cramp     Plan:  Patient was evaluated and treated and all questions answered.  Pes planovalgus -I explained to patient the etiology of pes planovalgus and relationship with Planter fasciitis and various treatment options were discussed.  Given patient foot structure in the setting of Planter fasciitis I believe patient will benefit from custom-made orthotics to help control the hindfoot motion support the arch of the foot and take the stress away from plantar fascial.  Patient agrees with the plan like to proceed with orthotics -Orthotics were dispensed no acute complaints   Muscle cramps -Continue using Flexeril as needed for muscle cramps   Athlete's foot -Clinically improving with Lotrisone cream  No follow-ups on file.

## 2022-06-25 ENCOUNTER — Encounter (HOSPITAL_COMMUNITY): Payer: Self-pay | Admitting: Emergency Medicine

## 2022-06-25 ENCOUNTER — Ambulatory Visit (HOSPITAL_COMMUNITY)
Admission: EM | Admit: 2022-06-25 | Discharge: 2022-06-25 | Disposition: A | Discharge status: Home / Self Care | Payer: PRIVATE HEALTH INSURANCE

## 2022-06-25 DIAGNOSIS — M79671 Pain in right foot: Secondary | ICD-10-CM | POA: Diagnosis not present

## 2022-06-25 DIAGNOSIS — B070 Plantar wart: Secondary | ICD-10-CM

## 2022-06-25 NOTE — ED Triage Notes (Signed)
Patient c/o blisters on the bottom of her right foot, very painful, itchy x 1 week.  The area is throbbing.  Patient has taken Aleve for the pain.

## 2022-06-25 NOTE — ED Provider Notes (Signed)
[place]    CSN: [id number] Arrival date & time: 06/25/22  1121      History   Chief Complaint Chief Complaint  Patient presents with   Blisters on Rt Foot    HPI Rondell Frick is a 61 y.o. female.   61 year old female pt, Alaina Donati, Presents to ER with chief complaint of right foot blisters x 1 week, painful. Hx of similar issues.   The history is provided by the patient. No language interpreter was used.    Past Medical History:  Diagnosis Date   Anemia    Asthma    Chronic bronchitis ([place])    History of blood transfusion 08/07/2013   "first one I've ever had was today" (08/07/2013)   Vocal cord mass    Wears glasses     Patient Active Problem List   Diagnosis Date Noted   Plantar warts 06/25/2022   Right foot pain 06/25/2022   Stridor    Expiratory stridor 06/12/2019   Hyperglycemia 06/12/2019   Leucocytosis 06/12/2019   Hives 06/12/2019   Post-operative state 05/24/2014   Fibroids 02/09/2014   Menorrhagia 02/09/2014   UTI (lower urinary tract infection) 08/07/2013   Anemia 08/07/2013    Past Surgical History:  Procedure Laterality Date   CYSTOSCOPY N/A 05/24/2014   Procedure: CYSTOSCOPY;  Surgeon: Lavonia Drafts, MD;  Location: [facility];  Service: Gynecology;  Laterality: N/A;   I & D EXTREMITY Left 08/16/2015   Procedure: IRRIGATION AND DEBRIDEMENT LEFT ANKLE AND EXPLORATION;  Surgeon: Leandrew Koyanagi, MD;  Location: [facility];  Service: Orthopedics;  Laterality: Left;   MICROLARYNGOSCOPY Right 06/19/2019   Procedure: MICROLARYNGOSCOPY WITH EXCISION OF VOCAL CORD POLYPS;  Surgeon: Melissa Montane, MD;  Location: [facility];  Service: ENT;  Laterality: Right;   ROBOTIC ASSISTED TOTAL HYSTERECTOMY N/A 05/24/2014   Procedure: ATTEMPTED ROBOTIC ASSISTED TOTAL HYSTERECTOMY ;  Surgeon: Lavonia Drafts, MD;  Location: [facility];  Service: Gynecology;  Laterality: N/A;  wound class contaminated   TENDON REPAIR Left 08/16/2015    Procedure: TENDON REPAIR;  Surgeon: Leandrew Koyanagi, MD;  Location: [facility];  Service: Orthopedics;  Laterality: Left;   VAGINAL HYSTERECTOMY N/A 05/24/2014   Procedure: HYSTERECTOMY VAGINAL,;  Surgeon: Lavonia Drafts, MD;  Location: [facility];  Service: Gynecology;  Laterality: N/A;  converted to vaginal procedure at 0926    OB History     Gravida  4   Para  3   Term  3   Preterm      AB  1   Living  3      SAB  0   IAB  1   Ectopic  0   Multiple  0   Live Births               Home Medications    Prior to Admission medications   Medication Sig Start Date End Date Taking? Authorizing Provider  acetaminophen (TYLENOL) 325 MG tablet Take 2 tablets (650 mg total) by mouth every 6 (six) hours as needed for moderate pain. 06/14/19   Eugenie Filler, MD  albuterol (PROVENTIL HFA;VENTOLIN HFA) 108 (90 Base) MCG/ACT inhaler Inhale 1-2 puffs into the lungs every 6 (six) hours as needed for wheezing or shortness of breath. 02/17/16   Tomi Likens, PA-C  ciprofloxacin (CIPRO) 500 MG tablet Take 1 tablet (500 mg total) by mouth 2 (two) times daily. One po bid x 7 days 11/08/19  Milton Ferguson, MD  clotrimazole-betamethasone (LOTRISONE) cream Apply 1 application. topically 2 (two) times daily. 05/06/22   Felipa Furnace, DPM  clotrimazole-betamethasone (LOTRISONE) cream Apply to affected area 2 times daily for up to one week. 05/06/22   Felipa Furnace, DPM  cyclobenzaprine (FLEXERIL) 10 MG tablet Take 1 tablet (10 mg total) by mouth 3 (three) times daily as needed for muscle spasms. 05/06/22   Felipa Furnace, DPM  diphenhydrAMINE (BENADRYL ALLERGY) 25 mg capsule Take 1-2 capsules (25-50 mg total) by mouth daily. Take 1 tablet 2 times daily x3 days, then 1 tablet daily x3 days, then stop. Patient not taking: Reported on 11/08/2019 06/14/19   Eugenie Filler, MD  EPINEPHrine 0.3 mg/0.3 mL IJ SOAJ injection Inject 0.3 mLs (0.3 mg total) into the muscle as needed  for anaphylaxis. 06/14/19   Eugenie Filler, MD  famotidine (PEPCID) 20 MG tablet Take 1-2 tablets (20-40 mg total) by mouth daily. Take 1 tablets ('20MG'$ ) 2 times daily x3 days, then 1 tablet daily. Patient not taking: Reported on 11/08/2019 06/14/19 06/13/20  Eugenie Filler, MD  HYDROcodone-acetaminophen (NORCO/VICODIN) 5-325 MG tablet Take 1 tablet by mouth every 6 (six) hours as needed for moderate pain. 11/08/19   Milton Ferguson, MD  metroNIDAZOLE (FLAGYL) 500 MG tablet Take 1 tablet (500 mg total) by mouth 3 (three) times daily. One po bid x 7 days 11/08/19   Milton Ferguson, MD  ondansetron (ZOFRAN ODT) 4 MG disintegrating tablet '4mg'$  ODT q4 hours prn nausea/vomit 11/08/19   Milton Ferguson, MD  predniSONE (DELTASONE) 20 MG tablet Take 2 tablets (40 mg total) by mouth daily. 04/16/22   Vanessa Kick, MD  psyllium (METAMUCIL SMOOTH TEXTURE) 28 % packet Take 1 packet by mouth daily.    [provider]  triamcinolone lotion (KENALOG) 0.1 % Apply 1 application topically 3 (three) times daily. Patient not taking: Reported on 08/03/2017 02/17/16   Tomi Likens, PA-C  vitamin B-12 (CYANOCOBALAMIN) 1000 MCG tablet Take 1,000 mcg by mouth daily.    [provider]    Family History Family History  Problem Relation Age of Onset   Breast cancer Mother    Diabetes Mellitus II Father    Colon cancer Neg Hx     Social History Social History   Tobacco Use   Smoking status: Every Day    Packs/day: 0.25    Years: 32.00    Total pack years: 8.00    Types: Cigarettes    Last attempt to quit: 02/14/2014    Years since quitting: 8.3   Smokeless tobacco: Never  Vaping Use   Vaping Use: Never used  Substance Use Topics   Alcohol use: Yes    Comment: wine during holidays   Drug use: No     Allergies   Bee venom   Review of Systems Review of Systems  Musculoskeletal:  Positive for gait problem.  Skin:  Positive for wound.  All other systems reviewed and are  negative.    Physical Exam Triage Vital Signs ED Triage Vitals  Enc Vitals Group     BP 06/25/22 1216 (!) 166/101     Pulse Rate 06/25/22 1216 86     Resp 06/25/22 1216 18     Temp 06/25/22 1216 97.9 F (36.6 C)     Temp Source 06/25/22 1216 Oral     SpO2 06/25/22 1216 99 %     Weight 06/25/22 1219 213 lb (96.6 kg)     Height  06/25/22 1219 '5\' 5"'$  (1.651 m)     Head Circumference --      Peak Flow --      Pain Score 06/25/22 1218 8     Pain Loc --      Pain Edu? --      Excl. in [place]? --    No data found.  Updated Vital Signs BP (!) 166/101 (BP Location: Left Arm)   Pulse 86   Temp 97.9 F (36.6 C) (Oral)   Resp 18   Ht '5\' 5"'$  (1.651 m)   Wt 213 lb (96.6 kg)   LMP 02/12/2014   SpO2 99%   BMI 35.45 kg/m   Visual Acuity Right Eye Distance:   Left Eye Distance:   Bilateral Distance:    Right Eye Near:   Left Eye Near:    Bilateral Near:     Physical Exam Vitals and nursing note reviewed.  Constitutional:      General: She is not in acute distress.    Appearance: She is well-developed.  HENT:     Head: Normocephalic and atraumatic.  Eyes:     Conjunctiva/sclera: Conjunctivae normal.  Cardiovascular:     Rate and Rhythm: Normal rate and regular rhythm.     Pulses:          Dorsalis pedis pulses are 2+ on the right side and 2+ on the left side.     Heart sounds: No murmur heard. Pulmonary:     Effort: Pulmonary effort is normal. No respiratory distress.     Breath sounds: Normal breath sounds.  Abdominal:     Palpations: Abdomen is soft.     Tenderness: There is no abdominal tenderness.  Musculoskeletal:        General: No swelling.     Cervical back: Neck supple.       Feet:  Skin:    General: Skin is warm and dry.     Capillary Refill: Capillary refill takes less than 2 seconds.  Neurological:     Mental Status: She is alert.  Psychiatric:        Mood and Affect: Mood normal.      UC Treatments / Results  Labs (all labs ordered are listed,  but only abnormal results are displayed) Labs Reviewed - No data to display  EKG   Radiology No results found.  Procedures Procedures (including critical care time)  Medications Ordered in UC Medications - No data to display  Initial Impression / Assessment and Plan / UC Course  I have reviewed the triage vital signs and the nursing notes.  Pertinent labs & imaging results that were available during my care of the patient were reviewed by me and considered in my medical decision making (see chart for details).     Ddx: Plantar wart, calluses Final Clinical Impressions(s) / UC Diagnoses   Final diagnoses:  Plantar warts  Right foot pain     Discharge Instructions      Please follow up with your podiatrist for further evaluation of plantar warts. May use over the counter wart pad, will need ot have shaved/frozen off.     ED Prescriptions   None    PDMP not reviewed this encounter.   Tori Milks, NP 63/87/56 1247

## 2022-06-25 NOTE — Discharge Instructions (Signed)
Please follow up with your podiatrist for further evaluation of plantar warts. May use over the counter wart pad, will need ot have shaved/frozen off.

## 2022-08-03 ENCOUNTER — Encounter: Payer: Self-pay | Admitting: Podiatry

## 2022-08-03 ENCOUNTER — Ambulatory Visit (INDEPENDENT_AMBULATORY_CARE_PROVIDER_SITE_OTHER): Payer: PRIVATE HEALTH INSURANCE | Admitting: Podiatry

## 2022-08-03 DIAGNOSIS — L03115 Cellulitis of right lower limb: Secondary | ICD-10-CM | POA: Diagnosis not present

## 2022-08-03 DIAGNOSIS — B353 Tinea pedis: Secondary | ICD-10-CM

## 2022-08-03 DIAGNOSIS — L02619 Cutaneous abscess of unspecified foot: Secondary | ICD-10-CM

## 2022-08-03 MED ORDER — TERBINAFINE HCL 250 MG PO TABS: 250.0000 mg | ORAL_TABLET | Freq: Every day | ORAL | 0 refills | Status: DC

## 2022-08-03 NOTE — Progress Notes (Signed)
Subjective:   Patient ID: Shawna Rodgers, female   DOB: 61 y.o.   MRN: [id number]   HPI States she has developed a blister in between her third and fourth toes plantarly this been very sore and feels like it might be infected.  Also has problems with discoloration of the plantar arch and the heel right   ROS      Objective:  Physical Exam  Neurovascular status intact with a localized abscess measuring about 1 cm x 7 cm plantar aspect right third distal metatarsal that is localized no proximal edema erythema drainage with multiple dark patches noted right over left foot     Assessment:  Possibility that we are dealing with a fungal infection here with an abscess which is occurred right as patient did use an acid on this area     Plan:  H&P reviewed sterile prep and using sterile sharp instrumentation I opened up the abscess drained found to be serous with no indications of purulent like material.  I flushed it all out applied sterile dressing and then because of the fungal element and the possibility that this may be fungal I did place on oral antifungal for 45 days Lamisil.  Gave instructions if anything should occur any erythema edema drainage patient is to reappoint immediately or if any systemic signs of infection is to go to the hospital

## 2022-08-19 ENCOUNTER — Ambulatory Visit (INDEPENDENT_AMBULATORY_CARE_PROVIDER_SITE_OTHER): Payer: PRIVATE HEALTH INSURANCE | Admitting: Podiatry

## 2022-08-19 ENCOUNTER — Encounter: Payer: Self-pay | Admitting: Podiatry

## 2022-08-19 DIAGNOSIS — M7752 Other enthesopathy of left foot: Secondary | ICD-10-CM

## 2022-08-19 DIAGNOSIS — B353 Tinea pedis: Secondary | ICD-10-CM

## 2022-08-19 MED ORDER — CYCLOBENZAPRINE HCL 5 MG PO TABS: 5.0000 mg | ORAL_TABLET | Freq: Three times a day (TID) | ORAL | 1 refills | Status: DC | PRN

## 2022-08-19 MED ORDER — CLOTRIMAZOLE-BETAMETHASONE 1-0.05 % EX CREA: 1.0000 | TOPICAL_CREAM | Freq: Two times a day (BID) | CUTANEOUS | 0 refills | Status: DC

## 2022-08-19 NOTE — Progress Notes (Signed)
Subjective:  Patient ID: Shawna Rodgers, female    DOB: 05-23-1961,  MRN: [id number]  Chief Complaint  Patient presents with   Callouses    61 y.o. female presents with the above complaint.  Patient presents with primary complaint of left ankle pain.  She states been going for quite some time is progressive gotten worse she would like a steroid injection she also has secondary complaint of athlete's foot she would like to get it every prescription Lotrisone cream which seems to help.  She denies any other acute complaints   Review of Systems: Negative except as noted in the HPI. Denies N/V/F/Ch.  Past Medical History:  Diagnosis Date   Anemia    Asthma    Chronic bronchitis ([place])    History of blood transfusion 08/07/2013   "first one I've ever had was today" (08/07/2013)   Vocal cord mass    Wears glasses     Current Outpatient Medications:    clotrimazole-betamethasone (LOTRISONE) cream, Apply 1 Application topically 2 (two) times daily., Disp: 30 g, Rfl: 0   cyclobenzaprine (FLEXERIL) 5 MG tablet, Take 1 tablet (5 mg total) by mouth 3 (three) times daily as needed for muscle spasms., Disp: 30 tablet, Rfl: 1   acetaminophen (TYLENOL) 325 MG tablet, Take 2 tablets (650 mg total) by mouth every 6 (six) hours as needed for moderate pain., Disp:  , Rfl:    albuterol (PROVENTIL HFA;VENTOLIN HFA) 108 (90 Base) MCG/ACT inhaler, Inhale 1-2 puffs into the lungs every 6 (six) hours as needed for wheezing or shortness of breath., Disp: 1 Inhaler, Rfl: 0   ciprofloxacin (CIPRO) 500 MG tablet, Take 1 tablet (500 mg total) by mouth 2 (two) times daily. One po bid x 7 days, Disp: 14 tablet, Rfl: 0   clotrimazole-betamethasone (LOTRISONE) cream, Apply 1 application. topically 2 (two) times daily., Disp: 30 g, Rfl: 0   clotrimazole-betamethasone (LOTRISONE) cream, Apply to affected area 2 times daily for up to one week., Disp: 45 g, Rfl: 0   cyclobenzaprine (FLEXERIL) 10 MG tablet, Take 1 tablet  (10 mg total) by mouth 3 (three) times daily as needed for muscle spasms., Disp: 30 tablet, Rfl: 0   diphenhydrAMINE (BENADRYL ALLERGY) 25 mg capsule, Take 1-2 capsules (25-50 mg total) by mouth daily. Take 1 tablet 2 times daily x3 days, then 1 tablet daily x3 days, then stop. (Patient not taking: Reported on 11/08/2019), Disp: 9 capsule, Rfl: 0   EPINEPHrine 0.3 mg/0.3 mL IJ SOAJ injection, Inject 0.3 mLs (0.3 mg total) into the muscle as needed for anaphylaxis., Disp: 1 each, Rfl: 0   famotidine (PEPCID) 20 MG tablet, Take 1-2 tablets (20-40 mg total) by mouth daily. Take 1 tablets ('20MG'$ ) 2 times daily x3 days, then 1 tablet daily. (Patient not taking: Reported on 11/08/2019), Disp: 60 tablet, Rfl: 0   HYDROcodone-acetaminophen (NORCO/VICODIN) 5-325 MG tablet, Take 1 tablet by mouth every 6 (six) hours as needed for moderate pain., Disp: 20 tablet, Rfl: 0   metroNIDAZOLE (FLAGYL) 500 MG tablet, Take 1 tablet (500 mg total) by mouth 3 (three) times daily. One po bid x 7 days, Disp: 21 tablet, Rfl: 0   ondansetron (ZOFRAN ODT) 4 MG disintegrating tablet, '4mg'$  ODT q4 hours prn nausea/vomit, Disp: 12 tablet, Rfl: 0   predniSONE (DELTASONE) 20 MG tablet, Take 2 tablets (40 mg total) by mouth daily., Disp: 10 tablet, Rfl: 0   psyllium (METAMUCIL SMOOTH TEXTURE) 28 % packet, Take 1 packet by mouth daily., Disp: ,  Rfl:    terbinafine (LAMISIL) 250 MG tablet, Take 1 tablet (250 mg total) by mouth daily., Disp: 45 tablet, Rfl: 0   triamcinolone lotion (KENALOG) 0.1 %, Apply 1 application topically 3 (three) times daily. (Patient not taking: Reported on 08/03/2017), Disp: 60 mL, Rfl: 0   vitamin B-12 (CYANOCOBALAMIN) 1000 MCG tablet, Take 1,000 mcg by mouth daily., Disp: , Rfl:   Social History   Tobacco Use  Smoking Status Every Day   Packs/day: 0.25   Years: 32.00   Total pack years: 8.00   Types: Cigarettes   Last attempt to quit: 02/14/2014   Years since quitting: 8.5  Smokeless Tobacco Never     Allergies  Allergen Reactions   Bee Venom Hives and Rash   Objective:  There were no vitals filed for this visit. There is no height or weight on file to calculate BMI. Constitutional Well developed. Well nourished.  Vascular Dorsalis pedis pulses palpable bilaterally. Posterior tibial pulses palpable bilaterally. Capillary refill normal to all digits.  No cyanosis or clubbing noted. Pedal hair growth normal.  Neurologic Normal speech. Oriented to person, place, and time. Epicritic sensation to light touch grossly present bilaterally.  Dermatologic Epidermal lysis noted to bilateral plantar foot with subjective component of itching noted.  No wounds or lesion noted  Orthopedic: Pain palpation left ankle joint pain with range of motion of the ankle joint deep intra-articular pain noted.  No pain at the ATFL ligament, peroneal ligament, Achilles tendon.   Radiographs: None Assessment:   1. Capsulitis of ankle, left   2. Tinea pedis of both feet     Plan:  Patient was evaluated and treated and all questions answered.  Left ankle capsulitis -Asked the patient the etiology of capsulitis and various treatment options were discussed.  Given the amount of pain that she is having she will benefit from steroid injection.  She agrees with the plan like to proceed with steroid injection -A steroid injection was performed at left ankle using 1% plain Lidocaine and 10 mg of Kenalog. This was well tolerated.    Athlete's foot -I explained the patient the etiology of athlete's foot and various treatment options were discussed.  Given the amount of athlete's foot that is present she will benefit from Lotrisone cream.  Lotrisone cream was sent to the pharmacy.  I have asked her to apply twice a day.  She states understanding  No follow-ups on file.

## 2022-12-08 ENCOUNTER — Ambulatory Visit (HOSPITAL_COMMUNITY)
Admission: EM | Admit: 2022-12-08 | Discharge: 2022-12-08 | Disposition: A | Discharge status: Home / Self Care | Payer: Managed Care, Other (non HMO) | Attending: Internal Medicine | Admitting: Internal Medicine

## 2022-12-08 ENCOUNTER — Encounter (HOSPITAL_COMMUNITY): Payer: Self-pay | Admitting: *Deleted

## 2022-12-08 DIAGNOSIS — I10 Essential (primary) hypertension: Secondary | ICD-10-CM | POA: Insufficient documentation

## 2022-12-08 DIAGNOSIS — Z716 Tobacco abuse counseling: Secondary | ICD-10-CM | POA: Insufficient documentation

## 2022-12-08 DIAGNOSIS — B349 Viral infection, unspecified: Secondary | ICD-10-CM

## 2022-12-08 DIAGNOSIS — J069 Acute upper respiratory infection, unspecified: Secondary | ICD-10-CM | POA: Insufficient documentation

## 2022-12-08 DIAGNOSIS — F17210 Nicotine dependence, cigarettes, uncomplicated: Secondary | ICD-10-CM | POA: Diagnosis present

## 2022-12-08 LAB — CBC WITH DIFFERENTIAL/PLATELET
Abs Immature Granulocytes: 0.01 10*3/uL (ref 0.00–0.07)
Basophils Absolute: 0 10*3/uL (ref 0.0–0.1)
Basophils Relative: 0 %
Eosinophils Absolute: 0.1 10*3/uL (ref 0.0–0.5)
Eosinophils Relative: 2 %
HCT: 46.2 % — ABNORMAL HIGH (ref 36.0–46.0)
Hemoglobin: 14.4 g/dL (ref 12.0–15.0)
Immature Granulocytes: 0 %
Lymphocytes Relative: 52 %
Lymphs Abs: 3.2 10*3/uL (ref 0.7–4.0)
MCH: 30.3 pg (ref 26.0–34.0)
MCHC: 31.2 g/dL (ref 30.0–36.0)
MCV: 97.1 fL (ref 80.0–100.0)
Monocytes Absolute: 0.6 10*3/uL (ref 0.1–1.0)
Monocytes Relative: 9 %
Neutro Abs: 2.3 10*3/uL (ref 1.7–7.7)
Neutrophils Relative %: 37 %
Platelets: 218 10*3/uL (ref 150–400)
RBC: 4.76 MIL/uL (ref 3.87–5.11)
RDW: 12.1 % (ref 11.5–15.5)
WBC: 6.1 10*3/uL (ref 4.0–10.5)
nRBC: 0 % (ref 0.0–0.2)

## 2022-12-08 LAB — COMPREHENSIVE METABOLIC PANEL
ALT: 126 U/L — ABNORMAL HIGH (ref 0–44)
AST: 38 U/L (ref 15–41)
Albumin: 3.8 g/dL (ref 3.5–5.0)
Alkaline Phosphatase: 65 U/L (ref 38–126)
Anion gap: 10 (ref 5–15)
BUN: 10 mg/dL (ref 6–20)
CO2: 28 mmol/L (ref 22–32)
Calcium: 9.6 mg/dL (ref 8.9–10.3)
Chloride: 102 mmol/L (ref 98–111)
Creatinine, Ser: 0.81 mg/dL (ref 0.44–1.00)
GFR, Estimated: >60 mL/min (ref >60)
Glucose, Bld: 109 mg/dL — ABNORMAL HIGH (ref 70–99)
Potassium: 4.1 mmol/L (ref 3.5–5.1)
Sodium: 140 mmol/L (ref 135–145)
Total Bilirubin: 0.7 mg/dL (ref 0.3–1.2)
Total Protein: 7.6 g/dL (ref 6.5–8.1)

## 2022-12-08 MED ORDER — AMLODIPINE BESYLATE 10 MG PO TABS
10.0000 mg | ORAL_TABLET | Freq: Every day | ORAL | 2 refills | Status: AC
Start: 2022-12-08 — End: ?

## 2022-12-08 MED ORDER — PREDNISONE 20 MG PO TABS: 20.0000 mg | ORAL_TABLET | Freq: Every day | ORAL | 0 refills | Status: AC

## 2022-12-08 MED ORDER — BENZONATATE 100 MG PO CAPS: 100.0000 mg | ORAL_CAPSULE | Freq: Three times a day (TID) | ORAL | 0 refills | Status: DC | PRN

## 2022-12-08 MED ORDER — ALBUTEROL SULFATE HFA 108 (90 BASE) MCG/ACT IN AERS: 2.0000 | INHALATION_SPRAY | Freq: Four times a day (QID) | RESPIRATORY_TRACT | 0 refills | Status: AC | PRN

## 2022-12-08 MED ORDER — ONDANSETRON 4 MG PO TBDP: 4.0000 mg | ORAL_TABLET | Freq: Three times a day (TID) | ORAL | 0 refills | Status: AC | PRN

## 2022-12-08 NOTE — Discharge Instructions (Addendum)
Please increase oral fluid intake Take medications as prescribed Smoke cessation will help with your respiratory symptoms Please take your blood pressure medications consistently We will call you with recommendations if labs are abnormal The urgent care will help you set up a primary care appointment Return to urgent care if you have any other concerns.

## 2022-12-08 NOTE — ED Provider Notes (Signed)
[place]    CSN: [id number] Arrival date & time: 12/08/22  1355      History   Chief Complaint Chief Complaint  Patient presents with   Cough   Diarrhea    HPI Shawna Rodgers is a 62 y.o. female comes to the urgent care with 1 week history of fevers, chills, decreased appetite and worsening fatigue.  Patient's symptoms started over a week ago with fever, chills, generalized body aches as well as nasal congestion.  Patient's symptoms progressed to include decreased appetite and diarrhea with no vomiting.  She had nausea and some abdominal discomfort.  She also experiences generalized body aches.  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  Patient is vaccinated against COVID-19 virus.  She has tried to keep up with her oral fluid intake but that has been challenging because of the decreased appetite.  Patient has some shortness of breath and wheezing.  She denies any chest pain.  Patient smokes cigarettes.  She has over 20-pack-year history of tobacco use.  Patient is hypertensive and has not taking her blood pressure medication for several years.  Today her blood pressure is 172/103.  She denies any abdominal pain.  She has chest pain associated with cough.  Chest pain is pleuritic in nature and nonradiating.Marland Kitchen   HPI  Past Medical History:  Diagnosis Date   Anemia    Asthma    Chronic bronchitis ([place])    History of blood transfusion 08/07/2013   "first one I've ever had was today" (08/07/2013)   Vocal cord mass    Wears glasses     Patient Active Problem List   Diagnosis Date Noted   Plantar warts 06/25/2022   Right foot pain 06/25/2022   Stridor    Expiratory stridor 06/12/2019   Hyperglycemia 06/12/2019   Leucocytosis 06/12/2019   Hives 06/12/2019   Post-operative state 05/24/2014   Fibroids 02/09/2014   Menorrhagia 02/09/2014   UTI (lower urinary tract infection) 08/07/2013   Anemia 08/07/2013    Past Surgical  History:  Procedure Laterality Date   CYSTOSCOPY N/A 05/24/2014   Procedure: CYSTOSCOPY;  Surgeon: Lavonia Drafts, MD;  Location: [facility];  Service: Gynecology;  Laterality: N/A;   I & D EXTREMITY Left 08/16/2015   Procedure: IRRIGATION AND DEBRIDEMENT LEFT ANKLE AND EXPLORATION;  Surgeon: Leandrew Koyanagi, MD;  Location: [facility];  Service: Orthopedics;  Laterality: Left;   MICROLARYNGOSCOPY Right 06/19/2019   Procedure: MICROLARYNGOSCOPY WITH EXCISION OF VOCAL CORD POLYPS;  Surgeon: Melissa Montane, MD;  Location: [facility];  Service: ENT;  Laterality: Right;   ROBOTIC ASSISTED TOTAL HYSTERECTOMY N/A 05/24/2014   Procedure: ATTEMPTED ROBOTIC ASSISTED TOTAL HYSTERECTOMY ;  Surgeon: Lavonia Drafts, MD;  Location: [facility];  Service: Gynecology;  Laterality: N/A;  wound class contaminated   TENDON REPAIR Left 08/16/2015   Procedure: TENDON REPAIR;  Surgeon: Leandrew Koyanagi, MD;  Location: [facility];  Service: Orthopedics;  Laterality: Left;   VAGINAL HYSTERECTOMY N/A 05/24/2014   Procedure: HYSTERECTOMY VAGINAL,;  Surgeon: Lavonia Drafts, MD;  Location: [facility];  Service: Gynecology;  Laterality: N/A;  converted to vaginal procedure at 0926    OB History     Gravida  4   Para  3   Term  3   Preterm      AB  1   Living  3      SAB  0   IAB  1   Ectopic  0   Multiple  0   Live Births               Home Medications    Prior to Admission medications   Medication Sig Start Date End Date Taking? Authorizing Provider  albuterol (VENTOLIN HFA) 108 (90 Base) MCG/ACT inhaler Inhale 2 puffs into the lungs every 6 (six) hours as needed for wheezing or shortness of breath. 12/08/22  Yes Idara Woodside, Myrene Galas, MD  amLODipine (NORVASC) 10 MG tablet Take 1 tablet (10 mg total) by mouth daily. 12/08/22  Yes Mailynn Everly, Myrene Galas, MD  benzonatate (TESSALON) 100 MG capsule Take 1 capsule (100 mg total) by mouth 3 (three) times daily as needed for cough. 12/08/22  Yes  Zarina Pe, Myrene Galas, MD  ondansetron (ZOFRAN-ODT) 4 MG disintegrating tablet Take 1 tablet (4 mg total) by mouth every 8 (eight) hours as needed for nausea or vomiting. 12/08/22  Yes Sparrow Sanzo, Myrene Galas, MD  predniSONE (DELTASONE) 20 MG tablet Take 1 tablet (20 mg total) by mouth daily for 5 days. 12/08/22 12/13/22 Yes Zeric Baranowski, Myrene Galas, MD  acetaminophen (TYLENOL) 325 MG tablet Take 2 tablets (650 mg total) by mouth every 6 (six) hours as needed for moderate pain. 06/14/19   Eugenie Filler, MD  EPINEPHrine 0.3 mg/0.3 mL IJ SOAJ injection Inject 0.3 mLs (0.3 mg total) into the muscle as needed for anaphylaxis. 06/14/19   Eugenie Filler, MD    Family History Family History  Problem Relation Age of Onset   Breast cancer Mother    Diabetes Mellitus II Father    Colon cancer Neg Hx     Social History Social History   Tobacco Use   Smoking status: Every Day    Packs/day: 0.25    Years: 32.00    Total pack years: 8.00    Types: Cigarettes    Last attempt to quit: 02/14/2014    Years since quitting: 8.8   Smokeless tobacco: Never  Vaping Use   Vaping Use: Never used  Substance Use Topics   Alcohol use: Yes    Comment: occasionally   Drug use: No     Allergies   Bee venom   Review of Systems Review of Systems As per HPI  Physical Exam Triage Vital Signs ED Triage Vitals  Enc Vitals Group     BP 12/08/22 1652 (!) 172/103     Pulse Rate 12/08/22 1652 76     Resp 12/08/22 1652 16     Temp 12/08/22 1652 98.3 F (36.8 C)     Temp Source 12/08/22 1652 Oral     SpO2 12/08/22 1652 98 %     Weight --      Height --      Head Circumference --      Peak Flow --      Pain Score 12/08/22 1653 5     Pain Loc --      Pain Edu? --      Excl. in Early? --    No data found.  Updated Vital Signs BP (!) 172/103   Pulse 76   Temp 98.3 F (36.8 C) (Oral)   Resp 16   LMP 02/12/2014   SpO2 98%   Visual Acuity Right Eye Distance:   Left Eye Distance:   Bilateral Distance:     Right Eye Near:   Left Eye Near:    Bilateral Near:     Physical Exam Vitals and nursing  note reviewed.  Constitutional:      Appearance: She is ill-appearing.  HENT:     Right Ear: Tympanic membrane normal.     Left Ear: Tympanic membrane normal.  Cardiovascular:     Rate and Rhythm: Normal rate and regular rhythm.     Pulses: Normal pulses.     Heart sounds: Normal heart sounds.  Pulmonary:     Effort: Pulmonary effort is normal.     Breath sounds: Wheezing present.  Neurological:     Mental Status: She is alert.      UC Treatments / Results  Labs (all labs ordered are listed, but only abnormal results are displayed) Labs Reviewed  CBC WITH DIFFERENTIAL/PLATELET  COMPREHENSIVE METABOLIC PANEL    EKG   Radiology No results found.  Procedures Procedures (including critical care time)  Medications Ordered in UC Medications - No data to display  Initial Impression / Assessment and Plan / UC Course  I have reviewed the triage vital signs and the nursing notes.  Pertinent labs & imaging results that were available during my care of the patient were reviewed by me and considered in my medical decision making (see chart for details).     1.  Viral respiratory infection with wheezing: Zofran as needed for nausea/vomiting Tessalon Perles as needed for cough Albuterol inhaler Prednisone 20 mg orally daily for 5 days Maintain adequate hydration No indication for COVID testing given the duration of your symptoms Return to urgent care if you have worsening symptoms  2.  Uncontrolled hypertension: Amlodipine 10 mg orally daily Medication compliance emphasized CBC, CMP Will call patient with recommendations if labs are abnormal Return to urgent care if symptoms worsen  3.  Chronic tobacco use: Tobacco cessation counseling was given Time spent on tobacco cessation counseling was less than 10 minutes Patient is in the contemplative stage of smoke  cessation. Final Clinical Impressions(s) / UC Diagnoses   Final diagnoses:  Viral URI with cough  Hypertension, uncontrolled  Acute bronchospasm due to viral infection     Discharge Instructions      Please increase oral fluid intake Take medications as prescribed Smoke cessation will help with your respiratory symptoms Please take your blood pressure medications consistently We will call you with recommendations if labs are abnormal The urgent care will help you set up a primary care appointment Return to urgent care if you have any other concerns.   ED Prescriptions     Medication Sig Dispense Auth. Provider   amLODipine (NORVASC) 10 MG tablet Take 1 tablet (10 mg total) by mouth daily. 30 tablet Gorden Stthomas, Myrene Galas, MD   ondansetron (ZOFRAN-ODT) 4 MG disintegrating tablet Take 1 tablet (4 mg total) by mouth every 8 (eight) hours as needed for nausea or vomiting. 20 tablet Kymber Kosar, Myrene Galas, MD   albuterol (VENTOLIN HFA) 108 (90 Base) MCG/ACT inhaler Inhale 2 puffs into the lungs every 6 (six) hours as needed for wheezing or shortness of breath. 6.7 g Naithan Delage, Myrene Galas, MD   predniSONE (DELTASONE) 20 MG tablet Take 1 tablet (20 mg total) by mouth daily for 5 days. 5 tablet Baleria Wyman, Myrene Galas, MD   benzonatate (TESSALON) 100 MG capsule Take 1 capsule (100 mg total) by mouth 3 (three) times daily as needed for cough. 21 capsule Cherrie Franca, Myrene Galas, MD      PDMP not reviewed this encounter.   Chase Picket, MD 12/08/22 [phone]

## 2022-12-08 NOTE — ED Triage Notes (Addendum)
C/O starting with cough, chills, diarrhea, poor appetite, fatigue, congestion, rhinorrhea with some sxs onset 12/01/22. Has been taking Alka Seltzer Cold Plus and Mucinex. States does feel she has been improving, but not quickly.

## 2023-03-23 ENCOUNTER — Other Ambulatory Visit: Payer: Self-pay | Admitting: Podiatry

## 2023-06-01 ENCOUNTER — Other Ambulatory Visit: Payer: Self-pay | Admitting: Internal Medicine

## 2023-06-01 ENCOUNTER — Ambulatory Visit
Admission: RE | Admit: 2023-06-01 | Discharge: 2023-06-01 | Disposition: A | Discharge status: Home / Self Care | Payer: Commercial Managed Care - HMO | Source: Ambulatory Visit | Attending: Internal Medicine | Admitting: Internal Medicine

## 2023-06-01 DIAGNOSIS — Z1231 Encounter for screening mammogram for malignant neoplasm of breast: Secondary | ICD-10-CM

## 2024-08-23 ENCOUNTER — Encounter: Payer: Self-pay | Admitting: Podiatry

## 2024-08-23 ENCOUNTER — Ambulatory Visit (INDEPENDENT_AMBULATORY_CARE_PROVIDER_SITE_OTHER): Payer: PRIVATE HEALTH INSURANCE

## 2024-08-23 ENCOUNTER — Ambulatory Visit (INDEPENDENT_AMBULATORY_CARE_PROVIDER_SITE_OTHER): Payer: PRIVATE HEALTH INSURANCE | Admitting: Podiatry

## 2024-08-23 DIAGNOSIS — M7752 Other enthesopathy of left foot: Secondary | ICD-10-CM | POA: Diagnosis not present

## 2024-08-23 DIAGNOSIS — M19072 Primary osteoarthritis, left ankle and foot: Secondary | ICD-10-CM

## 2024-08-23 NOTE — Progress Notes (Signed)
 Subjective:   Patient ID: Shawna Rodgers, female   DOB: 63 y.o.   MRN: [id number]   HPI Patient states she has had a lot of problems with her left ankle and states she had an injury a number of years ago.  States that its become something that is becoming increasingly difficult to walk in and she would like possibly a more definitive answer neuro   ROS      Objective:  Physical Exam  Vascular status with pain in the left ankle which may be an inflammatory condition possibly an arthritic condition or an injury to the osteochondral tissue or soft tissue     Assessment:  Possibility for joint inflammation or joint pathology left versus and is simply a localized inflammatory condition     Plan:  H&P reviewed and patient evaluated by another physician in group.  At this point I am going to go ahead send for ankle MRI and we will get results and decide what might be appropriate to help her long-term with possible arthroscopic procedure or other procedure to help as best as possible for condition

## 2024-09-10 ENCOUNTER — Other Ambulatory Visit

## 2024-09-12 ENCOUNTER — Telehealth: Payer: Self-pay | Admitting: Podiatry

## 2024-10-03 ENCOUNTER — Telehealth: Payer: Self-pay | Admitting: Podiatry

## 2024-10-03 NOTE — Telephone Encounter (Signed)
 Patient states her insurance is currently active. What is the status on MRI ?

## 2024-10-15 NOTE — Telephone Encounter (Signed)
 I do yeahn't know

## 2024-10-24 ENCOUNTER — Ambulatory Visit (INDEPENDENT_AMBULATORY_CARE_PROVIDER_SITE_OTHER)

## 2024-10-24 DIAGNOSIS — M7752 Other enthesopathy of left foot: Secondary | ICD-10-CM

## 2024-10-24 DIAGNOSIS — G5752 Tarsal tunnel syndrome, left lower limb: Secondary | ICD-10-CM | POA: Diagnosis not present

## 2024-10-24 MED ORDER — GABAPENTIN 300 MG PO CAPS: 300.0000 mg | ORAL_CAPSULE | Freq: Every day | ORAL | 3 refills | Status: AC

## 2024-10-24 NOTE — Progress Notes (Unsigned)
 Subjective:  Patient ID: Shawna Rodgers, female    DOB: [birth year],  MRN: [id number]  Chief Complaint  Patient presents with   Foot Pain    Rm12 Patient complains of pain and spasms in right foot/ sharp shooting pain and burning/ no treatment.    63 y.o. female presents with the above complaint.  She is here complaining of sharp, shooting pain to her left big toe and fifth toe.  She previously had an injury to her ankle in which she dropped a glass sheet onto the dorsal aspect of her ankle, causing a large wound.  She does have pain to this area but it is not her principal complaint. Patient was supposed to have a left ankle MRI but was unable to make her appointment.   Review of Systems: Negative except as noted in the HPI. Denies N/V/F/Ch.  Past Medical History:  Diagnosis Date   Anemia    Asthma    Chronic bronchitis ([facility])    History of blood transfusion 08/07/2013   first one I've ever had was today (08/07/2013)   Vocal cord mass    Wears glasses     Current Outpatient Medications:    acetaminophen  (TYLENOL ) 325 MG tablet, Take 2 tablets (650 mg total) by mouth every 6 (six) hours as needed for moderate pain., Disp:  , Rfl:    albuterol  (VENTOLIN  HFA) 108 (90 Base) MCG/ACT inhaler, Inhale 2 puffs into the lungs every 6 (six) hours as needed for wheezing or shortness of breath., Disp: 6.7 g, Rfl: 0   amLODipine  (NORVASC ) 10 MG tablet, Take 1 tablet (10 mg total) by mouth daily., Disp: 30 tablet, Rfl: 2   EPINEPHrine  0.3 mg/0.3 mL IJ SOAJ injection, Inject 0.3 mLs (0.3 mg total) into the muscle as needed for anaphylaxis., Disp: 1 each, Rfl: 0   gabapentin (NEURONTIN) 300 MG capsule, Take 1 capsule (300 mg total) by mouth daily. Take 1 capsule before bed, Disp: 30 capsule, Rfl: 3   ondansetron  (ZOFRAN -ODT) 4 MG disintegrating tablet, Take 1 tablet (4 mg total) by mouth every 8 (eight) hours as needed for nausea or vomiting., Disp: 20 tablet, Rfl: 0  Social History   Tobacco Use   Smoking Status Every Day   Current packs/day: 0.00   Average packs/day: 0.3 packs/day for 32.0 years (8.0 ttl pk-yrs)   Types: Cigarettes   Start date: 02/14/1982   Last attempt to quit: 02/14/2014   Years since quitting: 10.7  Smokeless Tobacco Never    Allergies  Allergen Reactions   Bee Venom Hives and Rash   Objective:  There were no vitals filed for this visit. There is no height or weight on file to calculate BMI. Constitutional Well developed. Well nourished. Oriented to person, place, and time.  Vascular Dorsalis pedis pulses palpable bilaterally. Posterior tibial pulses palpable bilaterally. Capillary refill normal to all digits.  No cyanosis or clubbing noted. Pedal hair growth normal.  Neurologic Normal speech. Epicritic sensation to light touch grossly present bilaterally. Positive Tinel's sign at tarsal tunnel and deep peroneal nerve, left  Dermatologic Skin texture and turgor are within normal limits.  No open wounds. Hyperkeratotic lesion with central core sub-2nd/3rd metatarsal head without signs of infection.  Painful to direct palpation. Hyperkeratotic lesion medial hallux and lateral 5th PIPJ.  Large cicatrix anterior ankle  Musculoskeletal: 5 out of 5 muscle strength all major pedal muscle groups including dorsiflexors.  Mild pes planus foot deformity.  Decreased range of motion through her subtalar, and talonavicular  joints.  Pain to palpation of the anterior ankle.  No pain with first metatarsophalangeal joint range of motion. 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    Radiographs: Taken and reviewed.  3 weightbearing views of the left foot were taken today.  They were marked right on x-ray but are of the left foot.  These demonstrate mild arthritic changes to the hindfoot and first metatarsophalangeal joint.  Joint spaces otherwise well-maintained.  Mild pes planus foot shape.  No acute findings such as fracture or  dislocation.  Assessment:   1. Tarsal tunnel syndrome of left side   2. Capsulitis of metatarsophalangeal (MTP) joint of left foot   3. Capsulitis of ankle, left    Plan:  - Patient was evaluated and treated and all questions answered.  Tarsal tunnel syndrome, left -Discussed with the patient the diagnosis of tarsal tunnel syndrome on her left side.  She does describe burning, and tingling sensations to both her big toe and her little toe without peripheral neuropathy.  She has a positive Tinel's sign at the tarsal tunnel. -EMG/NCV test ordered via referral to neurology.  Patient did also have a positive Tinel's along her deep peroneal nerve, please evaluate this if possible -Patient to follow-up with me following EMG/NCV to review results and possible surgical planning    Ankle capsulitis, left - Patient had old injury to her ankle when she dropped a large piece of glass on it.  There is a large scar in this area.  She does have function throughout the tendons across this area.  She has residual pain in her ankle.  This is not her principal complaint today, she would like this worked up in the future.  Consider MRI.  No follow-ups on file.  Prentice Ovens, DPM AACFAS Fellowship Trained Podiatric Surgeon [facility]

## 2024-11-13 ENCOUNTER — Encounter: Payer: Self-pay | Admitting: Neurology

## 2024-11-20 ENCOUNTER — Other Ambulatory Visit: Payer: Self-pay

## 2024-11-20 DIAGNOSIS — R202 Paresthesia of skin: Secondary | ICD-10-CM

## 2024-11-21 ENCOUNTER — Ambulatory Visit
Admission: RE | Admit: 2024-11-21 | Discharge: 2024-11-21 | Disposition: A | Source: Ambulatory Visit | Attending: Physician Assistant

## 2024-11-21 ENCOUNTER — Ambulatory Visit: Admitting: Neurology

## 2024-11-21 ENCOUNTER — Other Ambulatory Visit: Payer: Self-pay | Admitting: Physician Assistant

## 2024-11-21 DIAGNOSIS — R202 Paresthesia of skin: Secondary | ICD-10-CM

## 2024-11-21 DIAGNOSIS — Z1231 Encounter for screening mammogram for malignant neoplasm of breast: Secondary | ICD-10-CM

## 2024-11-21 NOTE — Procedures (Signed)
 [facility]  [address] [place], [address]  [place], [place] [address] Tel: [phone] Fax: [phone] Test Date:  11/21/2024  Patient: Shawna Rodgers DOB: 1961/06/17 Physician: Venetia Potters, MD  Sex: Female Height: 5' 5 Ref Phys: Prentice Ovens, DPM  ID#: [id number] Temp: 32.7C Technician:    History: This is a 63 year old female with left foot pain.  NCV & EMG Findings: Extensive electrodiagnostic evaluation of the left lower limb with additional nerve conduction studies of the right lower limb shows: Bilateral medial plantar sensory responses are absent. Left sural and superficial peroneal/fibular sensory responses are within normal limits. Left tibial (AH) motor response shows reduced amplitude (3.2 mV). Right tibial (AH) and left peroneal/fibular (EDB) motor responses are within normal limits. Left H reflex latency is absent. There is no evidence of active or chronic motor axon loss changes affecting any of the tested muscles on needle examination. Motor unit configuration and recruitment pattern is within normal limits.  Impression: This is an abnormal study. 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 There is not clear slowing at the tarsal tunnel or other definitive abnormalities to further localize. Bilateral medial plantar sensory responses are absent, but this could be normal for age. No definitive electrodiagnostic evidence of a left lumbosacral (L3-S1) motor radiculopathy. No electrodiagnostic evidence of a large fiber sensorimotor neuropathy.    ___________________________ Venetia Potters, MD    Nerve Conduction Studies Motor Nerve Results    Latency Amplitude F-Lat Segment Distance CV Comment  Site (ms) Norm (mV) Norm (ms)  (cm) (m/s) Norm   Left Fibular (EDB) Motor  Ankle 3.5  < 6.0 5.7  >  2.5        Bel fib head 10.3 - 4.9 -  Bel fib head-Ankle 31 46  > 40   Pop fossa 12.4 - 4.4 -  Pop fossa-Bel fib head 9 43 -   Left Tibial (AH) Motor  Ankle 3.9  < 6.0 *3.2  > 4.0        Knee 12.6 - 2.0 -  Knee-Ankle 41 47  > 40   Right Tibial (AH) Motor  Ankle 3.8  < 6.0 10.5  > 4.0         Sensory Sites    Neg Peak Lat Amplitude (O-P) Segment Distance Velocity Comment  Site (ms) Norm (V) Norm  (cm) (ms)   Left Medial Plantar (Ortho) Sensory  Great toe-Med mall *NR - *NR - Great toe-Med mall -    Right Medial Plantar (Ortho) Sensory  Great toe-Med mall *NR - *NR - Great toe-Med mall -    Left Superficial Fibular Sensory  14 cm-Ankle 3.1  < 4.6 11  > 3 14 cm-Ankle 14    Left Sural Sensory  Calf-Lat mall 2.5  < 4.6 7  > 3 Calf-Lat mall 14     H-Reflex Results    M-Lat H Lat H Neg Amp H-M Lat  Site (ms) (ms) Norm (mV) (ms)  Left Tibial H-Reflex  Pop fossa 5.5 ---  < 35.0 --- ---   Electromyography   Side Muscle Ins.Act Fibs Fasc Recrt Amp Dur Poly Activation Comment  Left Tib ant Nml Nml Nml Nml Nml Nml Nml Nml N/A  Left Gastroc MH Nml Nml Nml Nml Nml Nml Nml Nml N/A  Left FDL Nml Nml Nml Nml Nml Nml Nml  Nml N/A  Left AH Nml Nml Nml *N.E. *- *- *- *N.E. N/A  Left Vastus lat Nml Nml Nml Nml Nml Nml Nml Nml N/A      Waveforms:  Motor        Sensory           H-Reflex

## 2024-11-24 ENCOUNTER — Ambulatory Visit

## 2024-11-24 DIAGNOSIS — G5752 Tarsal tunnel syndrome, left lower limb: Secondary | ICD-10-CM

## 2024-11-24 DIAGNOSIS — M7752 Other enthesopathy of left foot: Secondary | ICD-10-CM

## 2024-11-24 DIAGNOSIS — M19072 Primary osteoarthritis, left ankle and foot: Secondary | ICD-10-CM

## 2024-11-26 NOTE — Addendum Note (Signed)
 Addended by: MAGDALEN BARTER on: 11/26/2024 09:16 AM   Modules accepted: Orders

## 2024-11-26 NOTE — Progress Notes (Addendum)
 "  Subjective:  Patient ID: Shawna Rodgers, female    DOB: Dec 21, 1960,  MRN: [id number]  Chief Complaint  Patient presents with   FOOT Pain    Rm 17  Patient follow up on gabapentin  and therapy/ pt says that gabapentin  is helping and she is starting to see improvement.  Patient returns to clinic today for repeat evaluation after having EMG/NCV testing to her lower extremities.  She does relate improvement in her symptoms since starting gabapentin .  Interval history 10/24/24: 63 y.o. female presents with the above complaint.  She is here complaining of sharp, shooting pain to her left big toe and fifth toe.  She previously had an injury to her ankle in which she dropped a glass sheet onto the dorsal aspect of her ankle, causing a large wound.  She does have pain to this area but it is not her principal complaint. Patient was supposed to have a left ankle MRI but was unable to make her appointment.   Review of Systems: Negative except as noted in the HPI. Denies N/V/F/Ch.  Past Medical History:  Diagnosis Date   Anemia    Asthma    Chronic bronchitis (HCC)    History of blood transfusion 08/07/2013   first one I've ever had was today (08/07/2013)   Vocal cord mass    Wears glasses     Current Outpatient Medications:    acetaminophen  (TYLENOL ) 325 MG tablet, Take 2 tablets (650 mg total) by mouth every 6 (six) hours as needed for moderate pain., Disp:  , Rfl:    albuterol  (VENTOLIN  HFA) 108 (90 Base) MCG/ACT inhaler, Inhale 2 puffs into the lungs every 6 (six) hours as needed for wheezing or shortness of breath., Disp: 6.7 g, Rfl: 0   amLODipine  (NORVASC ) 10 MG tablet, Take 1 tablet (10 mg total) by mouth daily., Disp: 30 tablet, Rfl: 2   EPINEPHrine  0.3 mg/0.3 mL IJ SOAJ injection, Inject 0.3 mLs (0.3 mg total) into the muscle as needed for anaphylaxis., Disp: 1 each, Rfl: 0   gabapentin  (NEURONTIN ) 300 MG capsule, Take 1 capsule (300 mg total) by mouth daily. Take 1 capsule before bed,  Disp: 30 capsule, Rfl: 3   ondansetron  (ZOFRAN -ODT) 4 MG disintegrating tablet, Take 1 tablet (4 mg total) by mouth every 8 (eight) hours as needed for nausea or vomiting., Disp: 20 tablet, Rfl: 0  Social History   Tobacco Use  Smoking Status Every Day   Current packs/day: 0.00   Average packs/day: 0.3 packs/day for 32.0 years (8.0 ttl pk-yrs)   Types: Cigarettes   Start date: 02/14/1982   Last attempt to quit: 02/14/2014   Years since quitting: 10.7  Smokeless Tobacco Never    Allergies  Allergen Reactions   Bee Venom Hives and Rash   Objective:  There were no vitals filed for this visit. There is no height or weight on file to calculate BMI. Constitutional Well developed. Well nourished. Oriented to person, place, and time.  Vascular Dorsalis pedis pulses palpable bilaterally. Posterior tibial pulses palpable bilaterally. Capillary refill normal to all digits.  No cyanosis or clubbing noted. Pedal hair growth normal.  Neurologic Normal speech. Epicritic sensation to light touch grossly present bilaterally. Positive Tinel's sign at tarsal tunnel and deep peroneal nerve, left.  Tinel signs are less sensitive than they were last visit.  Dermatologic Skin texture and turgor are within normal limits.  No open wounds. Large cicatrix anterior ankle  Musculoskeletal: 5 out of 5 muscle strength all  major pedal muscle groups including dorsiflexors.  Mild pes planus foot deformity.  Decreased range of motion through her subtalar, and talonavicular joints.  Pain to palpation of the anterior ankle.  No pain with first metatarsophalangeal joint range of motion. 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    EMG/NCV 11/21/24: Impression: This is an abnormal study. 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 There is not clear slowing at the tarsal tunnel or other definitive abnormalities to further localize. Bilateral medial plantar sensory responses are absent, but this could be normal for age. No definitive electrodiagnostic evidence of a left lumbosacral (L3-S1) motor radiculopathy. No electrodiagnostic evidence of a large fiber sensorimotor neuropathy.  Assessment:   1. Tarsal tunnel syndrome of left side   2. Capsulitis of ankle, left   3. Arthritis of left ankle     Plan:  - Patient was evaluated and treated and all questions answered.  Tarsal tunnel syndrome, left -Discussed with the patient the diagnosis of tarsal tunnel syndrome on her left side.  She does describe burning, and tingling sensations to both her big toe and her little toe without peripheral neuropathy.  She has a positive Tinel's sign at the tarsal tunnel. -EMG/NCV test reviewed that does show abnormalities on the left tibial nerve without clear location of compressing the tarsal tunnel.  Based on patient's symptomatology, I do believe that a tarsal tunnel release would be beneficial given these findings and clinical presentation.  Currently, she is improving while taking gabapentin .  She would like to continue this conservative treatment.  If she has any worsening in her tarsal tunnel syndrome, she will return to clinic for likely surgical workup.    Ankle capsulitis, left - Patient had old injury to her ankle when she dropped a large piece of glass on it.  There is a large scar in this area.  She does have function throughout the tendons across this area.  She has residual pain in her ankle.  This is not her principal complaint today, she would like this worked up in the future.  Consider MRI. - Due to continued pain in her left ankle and subjective description of weakness that she feels makes her unsteady, recommend physical therapy for gait normalization, strengthening muscles across the left ankle.   Prescription placed.    Prentice Ovens, DPM AACFAS [facility] Trained Podiatric Surgeon [facility]   "
